# Patient Record
Sex: Male | Born: 1950 | Race: White | Hispanic: No | Marital: Single
Health system: Southern US, Community
[De-identification: ages and names within clinical notes are randomized; demographics above are authoritative.]

---

## 2001-01-22 ENCOUNTER — Emergency Department (HOSPITAL_COMMUNITY): Admission: EM | Admit: 2001-01-22 | Discharge: 2001-01-23 | Payer: Self-pay | Admitting: Emergency Medicine

## 2002-03-28 ENCOUNTER — Emergency Department (HOSPITAL_COMMUNITY): Admission: EM | Admit: 2002-03-28 | Discharge: 2002-03-28 | Payer: Self-pay | Admitting: Emergency Medicine

## 2004-05-17 ENCOUNTER — Ambulatory Visit (HOSPITAL_COMMUNITY): Admission: RE | Admit: 2004-05-17 | Discharge: 2004-05-17 | Payer: Self-pay | Admitting: Specialist

## 2004-07-06 ENCOUNTER — Ambulatory Visit: Payer: Self-pay | Admitting: Family Medicine

## 2004-07-20 ENCOUNTER — Ambulatory Visit: Payer: Self-pay | Admitting: Family Medicine

## 2004-08-21 ENCOUNTER — Ambulatory Visit: Payer: Self-pay | Admitting: Family Medicine

## 2004-09-18 ENCOUNTER — Ambulatory Visit (HOSPITAL_BASED_OUTPATIENT_CLINIC_OR_DEPARTMENT_OTHER): Admission: RE | Admit: 2004-09-18 | Discharge: 2004-09-18 | Payer: Self-pay | Admitting: Specialist

## 2004-11-01 ENCOUNTER — Ambulatory Visit: Payer: Self-pay | Admitting: Family Medicine

## 2004-11-29 ENCOUNTER — Ambulatory Visit: Payer: Self-pay | Admitting: Family Medicine

## 2004-12-18 ENCOUNTER — Ambulatory Visit: Payer: Self-pay | Admitting: Family Medicine

## 2004-12-19 ENCOUNTER — Ambulatory Visit: Payer: Self-pay | Admitting: Family Medicine

## 2004-12-27 ENCOUNTER — Ambulatory Visit: Payer: Self-pay | Admitting: Family Medicine

## 2005-01-25 ENCOUNTER — Ambulatory Visit: Payer: Self-pay | Admitting: Family Medicine

## 2005-02-15 ENCOUNTER — Ambulatory Visit: Payer: Self-pay | Admitting: Family Medicine

## 2005-03-13 ENCOUNTER — Ambulatory Visit: Payer: Self-pay | Admitting: Family Medicine

## 2005-04-09 ENCOUNTER — Ambulatory Visit: Payer: Self-pay | Admitting: Family Medicine

## 2005-04-24 ENCOUNTER — Ambulatory Visit: Payer: Self-pay | Admitting: Family Medicine

## 2005-05-23 ENCOUNTER — Ambulatory Visit: Payer: Self-pay | Admitting: Family Medicine

## 2005-08-02 ENCOUNTER — Ambulatory Visit: Payer: Self-pay | Admitting: Family Medicine

## 2005-09-06 ENCOUNTER — Ambulatory Visit: Payer: Self-pay | Admitting: Family Medicine

## 2005-10-16 ENCOUNTER — Ambulatory Visit: Payer: Self-pay | Admitting: Family Medicine

## 2006-06-10 ENCOUNTER — Encounter: Payer: Self-pay | Admitting: Pulmonary Disease

## 2010-04-17 ENCOUNTER — Ambulatory Visit: Payer: Self-pay | Admitting: Pulmonary Disease

## 2010-04-17 DIAGNOSIS — G4700 Insomnia, unspecified: Secondary | ICD-10-CM

## 2010-04-17 DIAGNOSIS — E785 Hyperlipidemia, unspecified: Secondary | ICD-10-CM | POA: Insufficient documentation

## 2010-08-29 NOTE — Assessment & Plan Note (Signed)
Summary: consult for insomnia   Visit Type:  Initial Consult Copy to:  Dina Rich MD Primary Provider/Referring Provider:  Dina Rich MD  CC:  Sleep consult.  History of Present Illness: The pt is a 59y/o male who I have been asked to see for insomnia.  He has had issues with his sleep for over 20 years, and had sleep study in the past (2007)which showed no sleep disordered breathing or other significant cause for sleep disruption.  He has a history of working rotating shifts for many years, and describes his current sleep as "variable".  He typically goes to bed around 11pm, and feels he is not sleepy at that time.  It may take btw one and four hours to fall asleep, and he typically stays in bed and tosses and turns.  He also describes anxiety and panic at times.  Even if he falls asleep in one hour, he has issues with very frequent awakenings during the night and takes 30-38min to get back to sleep.  He c/o overactive bladder keeping him awake, and also has issues with chronic pain in his joints and muscles.  He does sleep in a different bed from his wife due to her snoring.  He will usually arise btw 6-8am to start his day.  He will occasionally nap during the day, but does not doze watching tv.  He denies any sleepiness with driving.  He does not drink coffee or tea, and rarely has caffeinated soda.  The pt's weight is down 30 pounds over the past 2 years.  Preventive Screening-Counseling & Management  Alcohol-Tobacco     Smoking Status: quit  Current Medications (verified): 1)  Methadone Hcl 5 Mg Tabs (Methadone Hcl) 2)  Baclofen 10 Mg Tabs (Baclofen) .... Take 1 Tablet By Mouth Four Times A Day As Needed 3)  Flurazepam Hcl 30 Mg Caps (Flurazepam Hcl) .... Take 1 Tab By Mouth At Bedtime As Needed For Sleep 4)  Pravastatin Sodium 80 Mg Tabs (Pravastatin Sodium) .... Take 1 Tablet By Mouth Once A Day  Allergies (verified): 1)  ! * Venlafaxine 2)  ! Morphine 3)  ! *  Cyclobenzaprine  Past History:  Past Medical History: : HYPERLIPIDEMIA (ICD-272.4)  Past Surgical History: 3 shoulder surgeries finger surgery  Family History: Reviewed history and no changes required. Lung cancer--brother, aunt, uncle Emphysema--father  Social History: Reviewed history and no changes required. Married lives with wife and son Children--2 Occupation--Disabled-- wroked at [employer] Patient states former smoker. Quit in 1982. smoked 2 ppd.  Smoking Status:  quit  Review of Systems       The patient complains of headaches and joint stiffness or pain.  The patient denies shortness of breath with activity, shortness of breath at rest, productive cough, non-productive cough, coughing up blood, chest pain, irregular heartbeats, acid heartburn, indigestion, loss of appetite, weight change, abdominal pain, difficulty swallowing, sore throat, tooth/dental problems, nasal congestion/difficulty breathing through nose, sneezing, itching, ear ache, anxiety, depression, hand/feet swelling, rash, change in color of mucus, and fever.    Vital Signs:  Patient profile:   60 year old male Height:      70 inches Weight:      204.13 pounds BMI:     29.40 O2 Sat:      98 % on Room air Temp:     97.5 degrees F oral Pulse rate:   61 / minute BP sitting:   136 / 80  (right arm) Cuff size:   regular  Vitals Entered By: Carver Fila (April 17, 2010 2:04 PM)  O2 Flow:  Room air CC: Sleep consult Comments meds and allergies updated Phone number updated Carver Fila  April 17, 2010 2:09 PM    Physical Exam  General:  wd male in nad Eyes:  PERRLA and EOMI.   Nose:  moderate turbinate hypertrophy, no purulence noted. Mouth:  normal palate and uvula, no exudates seen Neck:  no jvd, tmg, LN Lungs:  clear to auscultation Heart:  rrr, no mrg Abdomen:  soft and nontender, bs+ Extremities:  no edema or cyanosis pulses intact distally Neurologic:  alert and oriented, moves  all 4.   Impression & Recommendations:  Problem # 1:  PERSISTENT DISORDER INITIATING/MAINTAINING SLEEP (ICD-307.42) the pt is describing psychophysiologic insomnia that is being complicated by anxiety and chronic pain.  I have explained to the pt this is best treated with behavioral therapy rather than sedative type medications.  He will also need control of his pain at night, and treatment for his high anxiety level when going to bed.  I have outlined the steps of stimulus control therapy, but I suspect he will need input from behavioral specialist with CBT and treatment of his anxiety.  This is a very difficult problem, and is deep seeded from at least 20 years.  I expect very slow progress over time.  Patient Instructions: 1)  go to bed each night when you start getting sleepy...pick a time such as 1am..Marland KitchenIf you are not able to initiate sleep within , leave bedroom and read or watch tv until you can re-initiate sleep.  Do this as many times as it takes until you fall asleep or 7am comes. 2)  no napping during day, no caffeine after 10am. 3)  will send a note to Dr. Sol Passer recommending referral to psychology for CBT (cognitive behavioral therapy) and for treatment of anxiety.  Would also recommend working on pain control at night   Immunization History:  Influenza Immunization History:    Influenza:  historical (04/29/2009)   Appended Document: Orders Update    Clinical Lists Changes  Orders: Added new Service order of Consultation Level V [phone]) - Signed

## 2010-12-15 NOTE — Op Note (Signed)
Jack Bowers, Jack Bowers                  ACCOUNT NO.:  [id number]   MEDICAL RECORD NO.:  [id number]          PATIENT TYPE:  AMB   LOCATION:  DAY                          FACILITY[facility]  [facility]   PHYSICIAN:  Jene Every, M.D.    DATE OF BIRTH:  Jan 20, 1951   DATE OF PROCEDURE:  05/17/2004  DATE OF DISCHARGE:                                 OPERATIVE REPORT   PREOPERATIVE DIAGNOSIS:  Recurrent rotator cuff tear and impingement  syndrome of the right shoulder.   POSTOPERATIVE DIAGNOSES:  1.  Anterior labral tear, right shoulder.  2.  Impingement syndrome, right shoulder.  3.  Bursal-side tear of the rotator cuff.   PROCEDURES PERFORMED:  Right shoulder arthroscopy, debridement of labral  tear, subacromial decompression with bursectomy, and debridement of partial  tear of rotator cuff.   ANESTHESIA:  General.   ASSISTANT:  Roma Schanz, P.A.   BRIEF HISTORY AND INDICATION:  A 60 year old with refractory shoulder pain,  status post rotator cuff repair in the past, with increasing pain despite  conservative treatment, including subacromial corticosteroid injection, MRI  indicating possible rotator cuff arthropathy, impingement.  Operative  intervention is indicated for diagnosis and treatment.  Risks and benefits  discussed, including bleeding, infection, damage to vascular structures, no  change in symptoms, worsening symptoms, etc.   TECHNIQUE:  With the patient in supine position, __________ positioner,  after induction of adequate general anesthesia, 1 g Kefzol, the right  shoulder and upper extremity were prepped and draped in the usual sterile  fashion.  A surgical marker was utilized to delineate the acromion, AC joint  and the coracoid.  An incision was made over the skin for the posterolateral  portal.  With the arm in the 70/30 position, the cannula was advanced to the  glenohumeral joint, penetrating it atraumatically.  Irrigant was utilized to  insufflate the joint.   Immediately noted was degenerative tearing of the  anterior portion of the labrum.  I made an incision for an anterior portal  off the anterolateral aspect of the acromion and about a third of the way  toward the coracoid, advanced it into the joint beneath the biceps tendon.  The tear there appeared to be catching in between the glenoid and the  humerus.  I introduced the shaver and utilized to debride it to a stable  base.  I probed it.  There was no detachment of the labrum.  There was no  SLAP-type lesion.  The biceps was unremarkable.  Some degenerative changes  of the glenohumeral joint noted.  The rotator cuff, full inspection of that  revealed no evidence of a full-thickness tear.  Degenerative changes were  noted, however.  We redirected the camera into the subacromial space and  made incision for a lateral portal and advanced the cannula.  Noticed  immediately was exuberant hypertrophic bursa.  The shaver was introduced and  utilized to perform a bursectomy to delineate the acromion.  Exam of the  rotator cuff, the previous sutures, the Ethibond sutures were noted.  There  was a small bursal-side tear  of the rotator cuff.  This was probed.  There  was no evidence of a full-thickness tear noted.  No bony impingement was  noted.  There was again hypertrophic bursa, fibrosis, scar tissue.  This was  lysed and debrided.  Full inspection again of the rotator cuff revealed no  evidence of a full-thickness tear.  After the bursectomy, we then copiously  lavaged the joint.  All instrumentation was removed.  Portals were closed  with 4-0 nylon simple sutures.  Marcaine 0.25% with epinephrine was  infiltrated in the joint.  The wound was  dressed sterilely.  Placed in a sling, extubated without difficulty, and  transported to the recovery room in satisfactory condition.   The patient tolerated the procedure well with no complications.     [name]   JB/MEDQ  D:  05/17/2004  T:  05/17/2004   Job:  [id number]

## 2010-12-15 NOTE — Op Note (Signed)
NAMEDRAYLEN, Bowers                  ACCOUNT NO.:  [id number]   MEDICAL RECORD NO.:  [id number]          PATIENT TYPE:  AMB   LOCATION:  [facility]                         FACILITY:  [facility]   PHYSICIAN:  Jene Every, M.D.    DATE OF BIRTH:  February 07, 1951   DATE OF PROCEDURE:  DATE OF DISCHARGE:                                 OPERATIVE REPORT   PREOPERATIVE DIAGNOSES:  1.  Rotator cuff repair.  2.  Impingement syndrome, left shoulder.   POSTOPERATIVE DIAGNOSES:  1.  Impingement syndrome, left shoulder.  2.  Minor degenerative fraying of rotator cuff.   PROCEDURE PERFORMED:  1.  Examination under anesthesia.  2.  Left shoulder arthroscopy.  3.  Subacromial decompression.  4.  Bursectomy.   ANESTHESIA:  General.   ASSISTANT:  Roma Schanz, P.A.   HISTORY/INDICATION:  This is a 60 year old male with refractory impingement  syndrome of the left shoulder.  Despite conservative treatment and risks,  subacromial corticosteroid injections which gave him temporary relief, he  had impingement pain.  MRI, though, indicating no evidence of a true rotator  cuff tear with significant bony pathology.  It was not tender over the [facility].  Due to the fact that he was unable to continue to work at [employer], he was  indicated for a diagnostic arthroscopy and bursectomy, possible open rotator  cuff repair of the rotator cuff was noted.  Risks and benefits were  discussed including bleeding, infection, damage to vascular structures,  suboptimal range of motion, recurrent tear, adhesive capsulitis, no changes  in symptoms, worsening symptoms, etc.   TECHNIQUE:  The patient was placed in the supine beach chair position after  the induction of adequate general anesthesia and 1 g Kefzol.  Left shoulder  and upper extremity was prepped and draped in the usual sterile fashion.  I  performed range of motion.  He had no significant adhesive capsulitis.  A  marker was utilized to delineate the acromion and the  coracoid.  A 0.25%  Marcaine with epinephrine was infiltrated in the subacromial space.  Incision was made posterolaterally to the skin only and over the  anterolateral aspect of the acromion approximately 3 cm from the corner.  With 70/30 traction applied to the arm, a blunt cannula was introduced into  the glenohumeral joint through the capsule.  It penetrated atraumatically  towards the coracoid without difficulty.  Irrigant was utilized to  insufflate the joint and inspection revealed normal labrum, normal biceps,  normal biceps attachment, normal subscap, minor fraying of the undersurface  of the rotator cuff.  No significant glenohumeral degenerative changes were  noted.  We then redirected the camera in the subacromial space and placed a  portal anterolaterally in the subacromial space.  Hypertrophic bursa was  noted with adhesions.  These adhesions were debrided with a 4.2 shaver.  We  then detached the ligament from the anterolateral aspect of the acromion  utilizing an ArthroWand.  Full inspection of the rotator cuff and probing of  it anteriorly and posteriorly revealed no significant defects.  Full  bursectomy had been performed.  We had marked the anterolateral aspect of  the acromion with an 18-gauge needle for delineation.  Next, the joint was  copiously lavaged.  All instrumentation was removed.  Portals were closed  with 4-0 nylon simple sutures.  A 0.25% Marcaine with epinephrine was  infiltrated in the subcutaneous space in the subacromial joint.  The wound  was then dressed sterilely.  The patient was extubated without difficulty  and transported to the recovery room in satisfactory condition.   The patient tolerated the procedure without complication.      JB/MEDQ  D:  09/18/2004  T:  09/18/2004  Job:  [id number]

## 2015-10-27 ENCOUNTER — Ambulatory Visit
Admission: RE | Admit: 2015-10-27 | Discharge: 2015-10-27 | Disposition: A | Discharge status: Home / Self Care | Payer: Medicare Other | Source: Ambulatory Visit | Attending: Physical Medicine and Rehabilitation | Admitting: Physical Medicine and Rehabilitation

## 2015-10-27 ENCOUNTER — Other Ambulatory Visit: Payer: Self-pay | Admitting: Physical Medicine and Rehabilitation

## 2015-10-27 DIAGNOSIS — M79672 Pain in left foot: Secondary | ICD-10-CM

## 2017-07-09 ENCOUNTER — Other Ambulatory Visit: Payer: Self-pay | Admitting: Physical Medicine and Rehabilitation

## 2017-07-09 DIAGNOSIS — M545 Low back pain: Secondary | ICD-10-CM

## 2017-07-13 ENCOUNTER — Ambulatory Visit
Admission: RE | Admit: 2017-07-13 | Discharge: 2017-07-13 | Disposition: A | Discharge status: Home / Self Care | Payer: Medicare Other | Source: Ambulatory Visit | Attending: Physical Medicine and Rehabilitation | Admitting: Physical Medicine and Rehabilitation

## 2017-07-13 DIAGNOSIS — M545 Low back pain: Secondary | ICD-10-CM

## 2018-10-20 DIAGNOSIS — M797 Fibromyalgia: Secondary | ICD-10-CM

## 2018-10-20 DIAGNOSIS — E119 Type 2 diabetes mellitus without complications: Secondary | ICD-10-CM | POA: Diagnosis not present

## 2018-10-20 DIAGNOSIS — N179 Acute kidney failure, unspecified: Secondary | ICD-10-CM

## 2018-10-20 DIAGNOSIS — N183 Chronic kidney disease, stage 3 (moderate): Secondary | ICD-10-CM | POA: Diagnosis not present

## 2018-10-20 DIAGNOSIS — E860 Dehydration: Secondary | ICD-10-CM

## 2018-10-20 DIAGNOSIS — I10 Essential (primary) hypertension: Secondary | ICD-10-CM

## 2018-10-20 DIAGNOSIS — R6889 Other general symptoms and signs: Secondary | ICD-10-CM

## 2018-10-20 DIAGNOSIS — Q809 Congenital ichthyosis, unspecified: Secondary | ICD-10-CM

## 2018-10-21 DIAGNOSIS — E119 Type 2 diabetes mellitus without complications: Secondary | ICD-10-CM | POA: Diagnosis not present

## 2018-10-21 DIAGNOSIS — N183 Chronic kidney disease, stage 3 (moderate): Secondary | ICD-10-CM | POA: Diagnosis not present

## 2018-10-21 DIAGNOSIS — R6889 Other general symptoms and signs: Secondary | ICD-10-CM | POA: Diagnosis not present

## 2018-10-21 DIAGNOSIS — N179 Acute kidney failure, unspecified: Secondary | ICD-10-CM | POA: Diagnosis not present

## 2018-10-22 DIAGNOSIS — N179 Acute kidney failure, unspecified: Secondary | ICD-10-CM | POA: Diagnosis not present

## 2018-10-22 DIAGNOSIS — E119 Type 2 diabetes mellitus without complications: Secondary | ICD-10-CM | POA: Diagnosis not present

## 2018-10-23 ENCOUNTER — Inpatient Hospital Stay (HOSPITAL_COMMUNITY)
Admission: AD | Admit: 2018-10-23 | Discharge: 2018-11-28 | DRG: 870 | Disposition: E | Payer: Medicare Other | Source: Other Acute Inpatient Hospital | Attending: Pulmonary Disease | Admitting: Pulmonary Disease

## 2018-10-23 ENCOUNTER — Inpatient Hospital Stay: Payer: Self-pay

## 2018-10-23 ENCOUNTER — Inpatient Hospital Stay (HOSPITAL_COMMUNITY): Payer: Medicare Other

## 2018-10-23 DIAGNOSIS — G4730 Sleep apnea, unspecified: Secondary | ICD-10-CM | POA: Diagnosis not present

## 2018-10-23 DIAGNOSIS — Z885 Allergy status to narcotic agent status: Secondary | ICD-10-CM | POA: Diagnosis not present

## 2018-10-23 DIAGNOSIS — E872 Acidosis: Secondary | ICD-10-CM | POA: Diagnosis not present

## 2018-10-23 DIAGNOSIS — J920 Pleural plaque with presence of asbestos: Secondary | ICD-10-CM | POA: Diagnosis present

## 2018-10-23 DIAGNOSIS — Z515 Encounter for palliative care: Secondary | ICD-10-CM | POA: Diagnosis not present

## 2018-10-23 DIAGNOSIS — G9341 Metabolic encephalopathy: Secondary | ICD-10-CM | POA: Diagnosis present

## 2018-10-23 DIAGNOSIS — N179 Acute kidney failure, unspecified: Secondary | ICD-10-CM | POA: Diagnosis not present

## 2018-10-23 DIAGNOSIS — I129 Hypertensive chronic kidney disease with stage 1 through stage 4 chronic kidney disease, or unspecified chronic kidney disease: Secondary | ICD-10-CM | POA: Diagnosis not present

## 2018-10-23 DIAGNOSIS — A4189 Other specified sepsis: Secondary | ICD-10-CM | POA: Diagnosis present

## 2018-10-23 DIAGNOSIS — E874 Mixed disorder of acid-base balance: Secondary | ICD-10-CM | POA: Diagnosis not present

## 2018-10-23 DIAGNOSIS — I952 Hypotension due to drugs: Secondary | ICD-10-CM | POA: Diagnosis not present

## 2018-10-23 DIAGNOSIS — N183 Chronic kidney disease, stage 3 unspecified: Secondary | ICD-10-CM

## 2018-10-23 DIAGNOSIS — E1165 Type 2 diabetes mellitus with hyperglycemia: Secondary | ICD-10-CM | POA: Diagnosis not present

## 2018-10-23 DIAGNOSIS — T4275XA Adverse effect of unspecified antiepileptic and sedative-hypnotic drugs, initial encounter: Secondary | ICD-10-CM | POA: Diagnosis not present

## 2018-10-23 DIAGNOSIS — A419 Sepsis, unspecified organism: Secondary | ICD-10-CM

## 2018-10-23 DIAGNOSIS — G43909 Migraine, unspecified, not intractable, without status migrainosus: Secondary | ICD-10-CM | POA: Diagnosis not present

## 2018-10-23 DIAGNOSIS — J9601 Acute respiratory failure with hypoxia: Secondary | ICD-10-CM | POA: Diagnosis present

## 2018-10-23 DIAGNOSIS — L8990 Pressure ulcer of unspecified site, unspecified stage: Secondary | ICD-10-CM

## 2018-10-23 DIAGNOSIS — Z66 Do not resuscitate: Secondary | ICD-10-CM | POA: Diagnosis not present

## 2018-10-23 DIAGNOSIS — J189 Pneumonia, unspecified organism: Secondary | ICD-10-CM | POA: Diagnosis present

## 2018-10-23 DIAGNOSIS — F329 Major depressive disorder, single episode, unspecified: Secondary | ICD-10-CM | POA: Diagnosis not present

## 2018-10-23 DIAGNOSIS — R680 Hypothermia, not associated with low environmental temperature: Secondary | ICD-10-CM | POA: Diagnosis not present

## 2018-10-23 DIAGNOSIS — Z87891 Personal history of nicotine dependence: Secondary | ICD-10-CM

## 2018-10-23 DIAGNOSIS — E119 Type 2 diabetes mellitus without complications: Secondary | ICD-10-CM | POA: Diagnosis not present

## 2018-10-23 DIAGNOSIS — I4891 Unspecified atrial fibrillation: Secondary | ICD-10-CM | POA: Diagnosis not present

## 2018-10-23 DIAGNOSIS — R6521 Severe sepsis with septic shock: Secondary | ICD-10-CM | POA: Diagnosis present

## 2018-10-23 DIAGNOSIS — J9690 Respiratory failure, unspecified, unspecified whether with hypoxia or hypercapnia: Secondary | ICD-10-CM

## 2018-10-23 DIAGNOSIS — E1122 Type 2 diabetes mellitus with diabetic chronic kidney disease: Secondary | ICD-10-CM | POA: Diagnosis present

## 2018-10-23 DIAGNOSIS — J80 Acute respiratory distress syndrome: Secondary | ICD-10-CM

## 2018-10-23 DIAGNOSIS — J9600 Acute respiratory failure, unspecified whether with hypoxia or hypercapnia: Secondary | ICD-10-CM

## 2018-10-23 DIAGNOSIS — E875 Hyperkalemia: Secondary | ICD-10-CM | POA: Diagnosis not present

## 2018-10-23 DIAGNOSIS — Z978 Presence of other specified devices: Secondary | ICD-10-CM

## 2018-10-23 DIAGNOSIS — Z4659 Encounter for fitting and adjustment of other gastrointestinal appliance and device: Secondary | ICD-10-CM

## 2018-10-23 LAB — BLOOD GAS, ARTERIAL
Acid-base deficit: 9.7 mmol/L — ABNORMAL HIGH (ref 0.0–2.0)
Acid-base deficit: 7.8 mmol/L — ABNORMAL HIGH (ref 0.0–2.0)
Bicarbonate: 15.2 mmol/L — ABNORMAL LOW (ref 20.0–28.0)
Bicarbonate: 16.3 mmol/L — ABNORMAL LOW (ref 20.0–28.0)
Drawn by: 270211
Drawn by: 308601
FIO2: 100
FIO2: 100
MECHVT: 510 mL
O2 SAT: 97.4 %
O2 Saturation: 98.2 %
PEEP/CPAP: 12 cmH2O
PEEP: 8 cmH2O
PH ART: 7.326 — AB (ref 7.350–7.450)
Patient temperature: 98.6
Patient temperature: 100.9
RATE: 22 resp/min
RATE: 22 resp/min
VT: 510 mL
pCO2 arterial: 31.6 mmHg — ABNORMAL LOW (ref 32.0–48.0)
pCO2 arterial: 32.8 mmHg (ref 32.0–48.0)
pH, Arterial: 7.304 — ABNORMAL LOW (ref 7.350–7.450)
pO2, Arterial: 107 mmHg (ref 83.0–108.0)
pO2, Arterial: 132 mmHg — ABNORMAL HIGH (ref 83.0–108.0)

## 2018-10-23 LAB — CBC
HCT: 36.5 % — ABNORMAL LOW (ref 39.0–52.0)
Hemoglobin: 12 g/dL — ABNORMAL LOW (ref 13.0–17.0)
MCH: 31.5 pg (ref 26.0–34.0)
MCHC: 32.9 g/dL (ref 30.0–36.0)
MCV: 95.8 fL (ref 80.0–100.0)
Platelets: 156 10*3/uL (ref 150–400)
RBC: 3.81 MIL/uL — ABNORMAL LOW (ref 4.22–5.81)
RDW: 14.2 % (ref 11.5–15.5)
WBC: 5.9 10*3/uL (ref 4.0–10.5)
nRBC: 0 % (ref 0.0–0.2)

## 2018-10-23 LAB — CREATININE, SERUM
Creatinine, Ser: 1.57 mg/dL — ABNORMAL HIGH (ref 0.61–1.24)
GFR calc Af Amer: 51 mL/min — ABNORMAL LOW (ref >60)
GFR calc non Af Amer: 44 mL/min — ABNORMAL LOW (ref >60)

## 2018-10-23 LAB — TRIGLYCERIDES: Triglycerides: 247 mg/dL — ABNORMAL HIGH (ref <150)

## 2018-10-23 MED ORDER — FENTANYL BOLUS VIA INFUSION
50.0000 ug | INTRAVENOUS | Status: DC | PRN
  Filled 2018-10-23: qty 50

## 2018-10-23 MED ORDER — VECURONIUM BROMIDE 10 MG IV SOLR
10.0000 mg | Freq: Once | INTRAVENOUS | Status: AC
  Administered 2018-10-23: 10 mg via INTRAVENOUS
  Filled 2018-10-23: qty 10

## 2018-10-23 MED ORDER — SODIUM CHLORIDE 0.9 % IV SOLN
2.0000 g | INTRAVENOUS | Status: DC
  Administered 2018-10-24 – 2018-10-26 (×3): 2 g via INTRAVENOUS
  Filled 2018-10-23 (×3): qty 2

## 2018-10-23 MED ORDER — SODIUM CHLORIDE 0.9% FLUSH
10.0000 mL | Freq: Two times a day (BID) | INTRAVENOUS | Status: DC
  Administered 2018-10-23: 30 mL
  Administered 2018-10-24 – 2018-10-26 (×6): 10 mL
  Administered 2018-10-27: 30 mL
  Administered 2018-10-27 – 2018-10-28 (×2): 10 mL
  Administered 2018-10-29: 40 mL
  Administered 2018-10-29 – 2018-10-31 (×4): 10 mL

## 2018-10-23 MED ORDER — NOREPINEPHRINE 4 MG/250ML-% IV SOLN
0.0000 ug/min | INTRAVENOUS | Status: DC
  Administered 2018-10-23: 2 ug/min via INTRAVENOUS
  Filled 2018-10-23: qty 250

## 2018-10-23 MED ORDER — FENTANYL 2500MCG IN NS 250ML (10MCG/ML) PREMIX INFUSION
100.0000 ug/h | INTRAVENOUS | Status: DC
  Administered 2018-10-24 – 2018-10-25 (×2): 100 ug/h via INTRAVENOUS
  Administered 2018-10-26: 125 ug/h via INTRAVENOUS
  Filled 2018-10-23 (×3): qty 250

## 2018-10-23 MED ORDER — ARTIFICIAL TEARS OPHTHALMIC OINT
1.0000 "application " | TOPICAL_OINTMENT | Freq: Three times a day (TID) | OPHTHALMIC | Status: DC
  Administered 2018-10-23 – 2018-10-28 (×15): 1 via OPHTHALMIC
  Filled 2018-10-23: qty 3.5

## 2018-10-23 MED ORDER — SODIUM CHLORIDE 0.9% FLUSH: 10.0000 mL | INTRAVENOUS | Status: DC | PRN

## 2018-10-23 MED ORDER — SODIUM CHLORIDE 0.9 % IV SOLN
500.0000 mg | INTRAVENOUS | Status: DC
  Administered 2018-10-24 – 2018-10-30 (×7): 500 mg via INTRAVENOUS
  Filled 2018-10-23 (×7): qty 500

## 2018-10-23 MED ORDER — FENTANYL CITRATE (PF) 100 MCG/2ML IJ SOLN: 100.0000 ug | Freq: Once | INTRAMUSCULAR | Status: DC

## 2018-10-23 MED ORDER — SODIUM CHLORIDE 0.9 % IV SOLN
3.0000 ug/kg/min | INTRAVENOUS | Status: DC
  Administered 2018-10-23: 3 ug/kg/min via INTRAVENOUS
  Administered 2018-10-24 – 2018-10-28 (×6): 3.5 ug/kg/min via INTRAVENOUS
  Filled 2018-10-23 (×11): qty 20

## 2018-10-23 MED ORDER — PANTOPRAZOLE SODIUM 40 MG IV SOLR
40.0000 mg | Freq: Every day | INTRAVENOUS | Status: DC
  Administered 2018-10-23: 40 mg via INTRAVENOUS
  Filled 2018-10-23: qty 40

## 2018-10-23 MED ORDER — FENTANYL BOLUS VIA INFUSION
50.0000 ug | INTRAVENOUS | Status: DC | PRN
  Administered 2018-10-23 – 2018-10-26 (×2): 50 ug via INTRAVENOUS
  Filled 2018-10-23: qty 50

## 2018-10-23 MED ORDER — NOREPINEPHRINE 16 MG/250ML-% IV SOLN
0.0000 ug/min | INTRAVENOUS | Status: DC
  Administered 2018-10-24: 4 ug/min via INTRAVENOUS
  Administered 2018-10-26: 10 ug/min via INTRAVENOUS
  Filled 2018-10-23 (×3): qty 250

## 2018-10-23 MED ORDER — ORAL CARE MOUTH RINSE
15.0000 mL | OROMUCOSAL | Status: DC
  Administered 2018-10-23 – 2018-10-31 (×59): 15 mL via OROMUCOSAL

## 2018-10-23 MED ORDER — FENTANYL CITRATE (PF) 100 MCG/2ML IJ SOLN: 100.0000 ug | Freq: Once | INTRAMUSCULAR | Status: DC | PRN

## 2018-10-23 MED ORDER — FENTANYL CITRATE (PF) 100 MCG/2ML IJ SOLN
50.0000 ug | Freq: Once | INTRAMUSCULAR | Status: AC
  Administered 2018-10-23: 50 ug via INTRAVENOUS
  Filled 2018-10-23: qty 2

## 2018-10-23 MED ORDER — PROPOFOL 1000 MG/100ML IV EMUL
25.0000 ug/kg/min | INTRAVENOUS | Status: DC
  Administered 2018-10-24: 25 ug/kg/min via INTRAVENOUS
  Administered 2018-10-24: 35 ug/kg/min via INTRAVENOUS
  Administered 2018-10-24: 25 ug/kg/min via INTRAVENOUS
  Administered 2018-10-24: 45 ug/kg/min via INTRAVENOUS
  Administered 2018-10-24 – 2018-10-26 (×5): 25 ug/kg/min via INTRAVENOUS
  Administered 2018-10-26 (×3): 40 ug/kg/min via INTRAVENOUS
  Administered 2018-10-26: 25 ug/kg/min via INTRAVENOUS
  Administered 2018-10-27 (×2): 40 ug/kg/min via INTRAVENOUS
  Filled 2018-10-23 (×17): qty 100

## 2018-10-23 MED ORDER — HEPARIN SODIUM (PORCINE) 5000 UNIT/ML IJ SOLN
5000.0000 [IU] | Freq: Three times a day (TID) | INTRAMUSCULAR | Status: DC
  Administered 2018-10-23 – 2018-10-31 (×23): 5000 [IU] via SUBCUTANEOUS
  Filled 2018-10-23 (×22): qty 1

## 2018-10-23 MED ORDER — INSULIN ASPART 100 UNIT/ML ~~LOC~~ SOLN
0.0000 [IU] | SUBCUTANEOUS | Status: DC
  Administered 2018-10-24 – 2018-10-25 (×2): 2 [IU] via SUBCUTANEOUS
  Administered 2018-10-25 (×2): 3 [IU] via SUBCUTANEOUS
  Administered 2018-10-26: 2 [IU] via SUBCUTANEOUS
  Administered 2018-10-26: 3 [IU] via SUBCUTANEOUS
  Administered 2018-10-26: 5 [IU] via SUBCUTANEOUS
  Administered 2018-10-26: 2 [IU] via SUBCUTANEOUS
  Administered 2018-10-26: 3 [IU] via SUBCUTANEOUS
  Administered 2018-10-26: 2 [IU] via SUBCUTANEOUS
  Administered 2018-10-27 (×4): 3 [IU] via SUBCUTANEOUS
  Administered 2018-10-27 (×2): 2 [IU] via SUBCUTANEOUS
  Administered 2018-10-27: 3 [IU] via SUBCUTANEOUS
  Administered 2018-10-28: 8 [IU] via SUBCUTANEOUS
  Administered 2018-10-28: 5 [IU] via SUBCUTANEOUS
  Administered 2018-10-28: 11 [IU] via SUBCUTANEOUS
  Administered 2018-10-28: 3 [IU] via SUBCUTANEOUS

## 2018-10-23 MED ORDER — FENTANYL 2500MCG IN NS 250ML (10MCG/ML) PREMIX INFUSION
25.0000 ug/h | INTRAVENOUS | Status: DC
  Administered 2018-10-23: 25 ug/h via INTRAVENOUS
  Filled 2018-10-23: qty 250

## 2018-10-23 MED ORDER — CHLORHEXIDINE GLUCONATE CLOTH 2 % EX PADS
6.0000 | MEDICATED_PAD | Freq: Every day | CUTANEOUS | Status: DC
  Administered 2018-10-24 – 2018-10-25 (×3): 6 via TOPICAL

## 2018-10-23 MED ORDER — LACTATED RINGERS IV SOLN
INTRAVENOUS | Status: DC
  Administered 2018-10-24 – 2018-10-25 (×4): via INTRAVENOUS

## 2018-10-23 MED ORDER — CHLORHEXIDINE GLUCONATE 0.12% ORAL RINSE (MEDLINE KIT)
15.0000 mL | Freq: Two times a day (BID) | OROMUCOSAL | Status: DC
  Administered 2018-10-23 – 2018-10-31 (×16): 15 mL via OROMUCOSAL

## 2018-10-23 MED ORDER — PROPOFOL 1000 MG/100ML IV EMUL
0.0000 ug/kg/min | INTRAVENOUS | Status: DC
  Administered 2018-10-23: 30 ug/kg/min via INTRAVENOUS
  Filled 2018-10-23: qty 100

## 2018-10-23 MED ORDER — NOREPINEPHRINE BITARTRATE 1 MG/ML IV SOLN
0.0000 ug/min | INTRAVENOUS | Status: DC
  Filled 2018-10-23: qty 4

## 2018-10-23 NOTE — Progress Notes (Signed)
Peripherally Inserted Central Catheter/Midline Placement  The IV Nurse has discussed with the patient and/or persons authorized to consent for the patient, the purpose of this procedure and the potential benefits and risks involved with this procedure.  The benefits include less needle sticks, lab draws from the catheter, and the patient may be discharged home with the catheter. Risks include, but not limited to, infection, bleeding, blood clot (thrombus formation), and puncture of an artery; nerve damage and irregular heartbeat and possibility to perform a PICC exchange if needed/ordered by physician.  Alternatives to this procedure were also discussed.  Bard Power PICC patient education guide, fact sheet on infection prevention and patient information card has been provided to patient /or left at bedside.    PICC/Midline Placement Documentation  PICC Triple Lumen 10-30-2018 PICC Right Brachial 45 cm 0 cm (Active)  Indication for Insertion or Continuance of Line Limited venous access - need for IV therapy >5 days (PICC only);Vasoactive infusions 10/30/2018  7:05 PM  Exposed Catheter (cm) 0 cm October 30, 2018  7:05 PM  Site Assessment Clean;Dry;Intact Oct 30, 2018  7:05 PM  Lumen #1 Status Flushed;Saline locked;Blood return noted 30-Oct-2018  7:05 PM  Lumen #2 Status Flushed;Saline locked;Blood return noted October 30, 2018  7:05 PM  Lumen #3 Status Flushed;Saline locked;Blood return noted 2018/10/30  7:05 PM  Dressing Type Transparent 2018-10-30  7:05 PM  Dressing Status Clean;Dry;Intact;Antimicrobial disc in place 10/30/2018  7:05 PM  Dressing Change Due 10/30/18 10-30-2018  7:05 PM       Ethelda Chick October 30, 2018, 7:07 PM

## 2018-10-23 NOTE — H&P (Signed)
NAME:  MEREL KARAMAN, MRN:  [id number], DOB:  13-Aug-1950, LOS: 0 ADMISSION DATE:  (Not on file), CONSULTATION DATE:  3/26 REFERRING MD:  N/A, CHIEF COMPLAINT:  Acute hypoxic respiratory failure   Brief History   68 year old male patient admitted 3/26 with acute hypoxic respiratory failure in the setting of community-acquired pneumonia and presumed COVID-19 infection  History of present illness   68 year old male patient with history as mentioned below, initially presented to his PCP office on 3/19 With complaint of flulike symptoms, this apparently started around 3/15 and initially presented as headache.  On 3/19 the headache returned, he also had diffuse body aches and pains, a new cough, nasal congestion and sore throat.  The symptoms continued to worsen.  He reported no recent travel history and no known history of of sick contacts at that point.  His influenza a and B were checked and these were negative.  He returned to his primary care provider on 3/20.  At this point he continued to have headache, productive cough and developed new fever of 100.2.  He denied shortness of breath at that time.  He was diagnosed with a left lower lobe pneumonia by physical exam, and ordered chest x-ray.  Chest x-ray obtained was negative for acute disease did show some chronic scarring in the right upper lobe. 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  3/26 developed acute hypoxia requiring escalating oxygen & eventually intubated.   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 COVID-19 sent given possible  exposure.  3/24 worsening resp failure. Intubated. transferred to [facility] long hospital following intubation  Consults:    Procedures:  Intubation 3/26  Significant Diagnostic Tests:  3l23 LDH: 634, CRP 133.    Micro Data:  Influenza A/B 3/20: neg Respiratory culture 3/26 Blood culture 3/26 COVID-19 3/23>>>  Antimicrobials:  Azithromycin 3/23>>> Rocephin 3/23>>>   Interim history/subjective:  Arrived to [facility] on full vent support  Objective   There were no vitals taken for this visit. PAP: ()/()      No intake or output data in the 24 hours ending 10/09/2018 70 There were no vitals filed for this visit.  Examination: General: Elderly man, orally intubated, intermittent coughing, sedated on propofol drip HENT: No pallor, mild icterus Lungs: Decreased breath sounds bilateral, no rhonchi Cardiovascular: S1-S2 tacky, narrow complex on monitor Abdomen: Soft, nontender Extremities: Cool extremities, mild scaling Neuro: Sedated, RA SS -2 GU: Clear urine  Resolved Hospital Problem list     Assessment & Plan:  Acute hypoxic respiratory failure in the setting of community-acquired pneumonia presumed COVID-19 infection based on history Portable chest x-ray personally reviewed: BILateral airspace disease with extensive consolidation Plan PCXR NOW (post transport) Sputum culture F/u COVID -19 Day 4 of azith and rocephin Full vent support w/ ARDS protocol, started PEEP of 10 and titrate PAD protocol RASS goal -2 Avoid additional aerosol procedures  VAP bundle COVID-19 droplet and contact isolation ? Adding hydroxychloroquine or chloroquine?    Sirs/sepsis Plan Cont IVFs Cont tele  abx as above    History of stage III  chronic kidney disease -baseline cr 1.56 to 1.7 Plan Avoid hypotension Serial chemistries  Renal dose meds  NAG metabolic acidosis Plan F/u chemistry in am   History of hypertension Plan Hold antihypertensives  History of diabetes type  2 Plan ssi   History of major depression Plan Holding Lyrica and Remeron  Best practice:  Diet: NPO Pain/Anxiety/Delirium protocol (if indicated): 3/26 VAP protocol (if indicated): 3/26 DVT prophylaxis: Rembrandt heparin GI prophylaxis: PPI daily Glucose control: SSI Mobility: BR Code Status: full code  Family Communication: pending  Disposition:   Labs   CBC: No results for input(s): WBC, NEUTROABS, HGB, HCT, MCV, PLT in the last 168 hours.  Basic Metabolic Panel: No results for input(s): NA, K, CL, CO2, GLUCOSE, BUN, CREATININE, CALCIUM, MG, PHOS in the last 168 hours. GFR: CrCl cannot be calculated (No successful lab value found.). No results for input(s): PROCALCITON, WBC, LATICACIDVEN in the last 168 hours.  Liver Function Tests: No results for input(s): AST, ALT, ALKPHOS, BILITOT, PROT, ALBUMIN in the last 168 hours. No results for input(s): LIPASE, AMYLASE in the last 168 hours. No results for input(s): AMMONIA in the last 168 hours.  ABG No results found for: PHART, PCO2ART, PO2ART, HCO3, TCO2, ACIDBASEDEF, O2SAT   Coagulation Profile: No results for input(s): INR, PROTIME in the last 168 hours.  Cardiac Enzymes: No results for input(s): CKTOTAL, CKMB, CKMBINDEX, TROPONINI in the last 168 hours.  HbA1C: No results found for: HGBA1C  CBG: No results for input(s): GLUCAP in the last 168 hours.  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 Morphine     REACTION: itch     Home Medications  Prior to Admission medications   Not on File     Critical care time: 60 minutes.  The treatment and management of the patient's condition was required  based on the threat of imminent deterioration. This time reflects time spent by the physician evaluating, providing care and managing the critically ill patient's care. The time was spent at the immediate bedside (or on the same floor/unit and dedicated to this patient's care). Time involved in separately billable procedures is NOT included int he critical care time indicated above. Family meeting and update time may be included above if and only if the patient is unable/incompetent to participate in clinical interview and/or decision making, and the discussion was necessary to determining treatment decisions  Cyril Mourning MD. Tonny Bollman. [facility] Pager [phone] If no response call [phone]   10/08/2018

## 2018-10-24 ENCOUNTER — Inpatient Hospital Stay (HOSPITAL_COMMUNITY): Payer: Medicare Other

## 2018-10-24 DIAGNOSIS — Z20828 Contact with and (suspected) exposure to other viral communicable diseases: Secondary | ICD-10-CM

## 2018-10-24 DIAGNOSIS — Z9911 Dependence on respirator [ventilator] status: Secondary | ICD-10-CM

## 2018-10-24 DIAGNOSIS — J80 Acute respiratory distress syndrome: Secondary | ICD-10-CM

## 2018-10-24 LAB — BASIC METABOLIC PANEL
ANION GAP: 9 (ref 5–15)
BUN: 21 mg/dL (ref 8–23)
CO2: 15 mmol/L — ABNORMAL LOW (ref 22–32)
Calcium: 7.5 mg/dL — ABNORMAL LOW (ref 8.9–10.3)
Chloride: 114 mmol/L — ABNORMAL HIGH (ref 98–111)
Creatinine, Ser: 1.93 mg/dL — ABNORMAL HIGH (ref 0.61–1.24)
GFR calc Af Amer: 40 mL/min — ABNORMAL LOW (ref >60)
GFR calc non Af Amer: 35 mL/min — ABNORMAL LOW (ref >60)
Glucose, Bld: 128 mg/dL — ABNORMAL HIGH (ref 70–99)
Potassium: 4.1 mmol/L (ref 3.5–5.1)
Sodium: 138 mmol/L (ref 135–145)

## 2018-10-24 LAB — BLOOD GAS, ARTERIAL
Acid-base deficit: 8.2 mmol/L — ABNORMAL HIGH (ref 0.0–2.0)
Bicarbonate: 16.8 mmol/L — ABNORMAL LOW (ref 20.0–28.0)
Drawn by: 422461
FIO2: 60
MECHVT: 510 mL
O2 Saturation: 95.3 %
PATIENT TEMPERATURE: 99
PEEP: 12 cmH2O
RATE: 22 resp/min
pCO2 arterial: 35.1 mmHg (ref 32.0–48.0)
pH, Arterial: 7.304 — ABNORMAL LOW (ref 7.350–7.450)
pO2, Arterial: 82.9 mmHg — ABNORMAL LOW (ref 83.0–108.0)

## 2018-10-24 LAB — GLUCOSE, CAPILLARY
Glucose-Capillary: 111 mg/dL — ABNORMAL HIGH (ref 70–99)
Glucose-Capillary: 112 mg/dL — ABNORMAL HIGH (ref 70–99)
Glucose-Capillary: 135 mg/dL — ABNORMAL HIGH (ref 70–99)
Glucose-Capillary: 87 mg/dL (ref 70–99)
Glucose-Capillary: 93 mg/dL (ref 70–99)
Glucose-Capillary: 98 mg/dL (ref 70–99)

## 2018-10-24 LAB — CBC
HCT: 37.4 % — ABNORMAL LOW (ref 39.0–52.0)
Hemoglobin: 12.1 g/dL — ABNORMAL LOW (ref 13.0–17.0)
MCH: 31.8 pg (ref 26.0–34.0)
MCHC: 32.4 g/dL (ref 30.0–36.0)
MCV: 98.2 fL (ref 80.0–100.0)
NRBC: 0 % (ref 0.0–0.2)
Platelets: 159 10*3/uL (ref 150–400)
RBC: 3.81 MIL/uL — ABNORMAL LOW (ref 4.22–5.81)
RDW: 14.5 % (ref 11.5–15.5)
WBC: 6.1 10*3/uL (ref 4.0–10.5)

## 2018-10-24 LAB — HIV ANTIBODY (ROUTINE TESTING W REFLEX): HIV Screen 4th Generation wRfx: NONREACTIVE

## 2018-10-24 LAB — MAGNESIUM: Magnesium: 1.7 mg/dL (ref 1.7–2.4)

## 2018-10-24 LAB — PHOSPHORUS: Phosphorus: 3.4 mg/dL (ref 2.5–4.6)

## 2018-10-24 MED ORDER — ZINC SULFATE 220 (50 ZN) MG PO CAPS
220.0000 mg | ORAL_CAPSULE | Freq: Every day | ORAL | Status: DC
  Administered 2018-10-24 – 2018-10-27 (×4): 220 mg
  Filled 2018-10-24 (×4): qty 1

## 2018-10-24 MED ORDER — HYDROXYCHLOROQUINE SULFATE 200 MG PO TABS
400.0000 mg | ORAL_TABLET | Freq: Two times a day (BID) | ORAL | Status: AC
  Administered 2018-10-24 (×2): 400 mg
  Filled 2018-10-24 (×2): qty 2

## 2018-10-24 MED ORDER — HYDROXYCHLOROQUINE SULFATE 200 MG PO TABS
200.0000 mg | ORAL_TABLET | Freq: Two times a day (BID) | ORAL | Status: AC
  Administered 2018-10-25 – 2018-10-28 (×8): 200 mg
  Filled 2018-10-24 (×8): qty 1

## 2018-10-24 MED ORDER — PANTOPRAZOLE SODIUM 40 MG PO PACK
40.0000 mg | PACK | ORAL | Status: DC
  Administered 2018-10-24 – 2018-10-31 (×8): 40 mg
  Filled 2018-10-24 (×8): qty 20

## 2018-10-24 MED ORDER — VITAL HIGH PROTEIN PO LIQD: 1000.0000 mL | ORAL | Status: DC

## 2018-10-24 MED ORDER — PRO-STAT SUGAR FREE PO LIQD
30.0000 mL | Freq: Every day | ORAL | Status: DC
  Administered 2018-10-24 – 2018-10-28 (×5): 30 mL
  Filled 2018-10-24 (×5): qty 30

## 2018-10-24 MED ORDER — PRO-STAT SUGAR FREE PO LIQD: 30.0000 mL | Freq: Two times a day (BID) | ORAL | Status: DC

## 2018-10-24 MED ORDER — ACETAMINOPHEN 160 MG/5ML PO SOLN
650.0000 mg | ORAL | Status: DC | PRN
  Administered 2018-10-24 – 2018-10-31 (×8): 650 mg
  Filled 2018-10-24 (×8): qty 20.3

## 2018-10-24 MED ORDER — ZINC SULFATE 220 (50 ZN) MG PO CAPS: 220.0000 mg | ORAL_CAPSULE | Freq: Two times a day (BID) | ORAL | Status: DC

## 2018-10-24 MED ORDER — VITAL AF 1.2 CAL PO LIQD
1000.0000 mL | ORAL | Status: DC
  Administered 2018-10-24 – 2018-10-25 (×3): 1000 mL
  Administered 2018-10-26: 1500 mL

## 2018-10-24 NOTE — Progress Notes (Addendum)
NUTRITION NOTE RD working remotely.   Consult received for TF initiation and management. Will order Vital AF 1.2 @ 60 ml/hr with 30 ml Prostat once/day. This regimen + kcal from current propofol rate will provide 2328 kcal (97% estimated kcal need), 123 grams of protein, and 1168 ml free water.  * Free water flush, if desired, to be per MD/NP.   RD will continue to follow per protocol.    Estimated Nutritional Needs:  Kcal:  2397 kcal Protein:  115-140 grams Fluid:  >/= 2.2 L/day   Trenton Gammon, MS, RD, LDN, [facility] Inpatient Clinical Dietitian Pager # [phone] After hours/weekend pager # [phone]

## 2018-10-24 NOTE — Progress Notes (Addendum)
Initial Nutrition Assessment  RD working remotely.   DOCUMENTATION CODES:   Not applicable  INTERVENTION:  - If patient to remain intubated >/= 24 hours and TF able to be initiated, recommend Vital AF 1.2 @ 60 ml/hr with 30 ml Prostat once/day.  - This regimen + kcal from current propofol rate will provide 2328 kcal (97% estimated kcal need), 123 grams of protein, and 1168 ml free water.   NUTRITION DIAGNOSIS:   Inadequate oral intake related to inability to eat as evidenced by NPO status.  GOAL:   Patient will meet greater than or equal to 90% of their needs  MONITOR:   Vent status, Weight trends, Labs, I & O's  REASON FOR ASSESSMENT:   Ventilator  ASSESSMENT:   68 year old male with hx of CKD stage 3, Type 2 DM, HTN, and depression. Patient was admitted 3/26 with acute hypoxic respiratory failure in the setting of community-acquired pneumonia and presumed COVID-19 infection. Intubated prior to transfer to [facility].  BMI indicates overweight status. Patient is intubated with OGT to LIS. RN flow sheet indicates 200 ml output at 6:00 AM today. Per review of chart, current weight is 207 lb, weight yesterday recorded as 199 lb, and weight on 3/20 at [facility] recorded as 203 lb.   Per Dr. Reginia Naas note/H&P yesterday afternoon: acute hypoxic respiratory failure in the setting of CAP with presumed COVID, r/o COVID, ARDS protocol, SIRS, sepsis, metabolic acidosis.  Patient is currently intubated on ventilator support MV: 10.7 L/min (per flow sheet, at 7:45 AM). Temp (24hrs), Avg:101.5 F (38.6 C), Min:99.8 F (37.7 C), Max:103.5 F (39.7 C) Propofol: 18.94 ml/hr as of 9 AM (500 kcal)   Medications reviewed; sliding scale novolog. Labs reviewed; Cl: 114 mmol/l, creatinine: 1.93 mg/dl, Ca: 7.5 mg/dl, GFR: 35 ml/min. IVF; LR @ 75 ml/hr. Drips as of 9 AM; propofol @ 35 mcg/kg/min, levo @ 5 mcg/min, fentanyl @ 100 mcg/hr, nimbex @ 3.5 mcg/kg/min.     NUTRITION - FOCUSED  PHYSICAL EXAM:  Unable to complete at this time.  Diet Order:   Diet Order            Diet NPO time specified  Diet effective now              EDUCATION NEEDS:   No education needs have been identified at this time  Skin:  Skin Assessment: Reviewed RN Assessment  Last BM:  PTA/unknown  Height:   Ht Readings from Last 1 Encounters:  November 05, 2018 5\' 10"  (1.778 m)    Weight:   Wt Readings from Last 1 Encounters:  10/24/18 94 kg    Ideal Body Weight:  75.45 kg  BMI:  Body mass index is 29.73 kg/m.  Estimated Nutritional Needs:   Kcal:  2397 kcal  Protein:  115-140 grams  Fluid:  >/= 2.2 L/day     Trenton Gammon, MS, RD, LDN, [facility] Inpatient Clinical Dietitian Pager # [phone] After hours/weekend pager # [phone]

## 2018-10-24 NOTE — Progress Notes (Addendum)
NAME:  Jack Bowers, MRN:  [id number], DOB:  15-Apr-1951, LOS: 1 ADMISSION DATE:  Nov 17, 2018, CONSULTATION DATE:  3/26 REFERRING MD:  N/A, CHIEF COMPLAINT:  Acute hypoxic respiratory failure   Brief History   68 year old male patient admitted 3/26 with acute hypoxic respiratory failure in the setting of community-acquired pneumonia and presumed COVID-19 infection   Past Medical History  CKD stage III, DM type II, HTN, major depression, migraines, h/o pleural plaque due to asbestos exposure, sleep apnea   Significant Hospital Events   3/23: Presented to [facility], started on azith and rocephin. COVID-19 sent given possible exposure.  3/26 worsening resp failure. Intubated. transferred to [facility] following intubation. PICC placed RUE.  3/27: no critical events overnight. Remains of ARDS protocol, CXR a little worse. Renal fxn has declines some. Placed on pressors overnight. Transducing CVP  Consults:    Procedures:  Intubation 3/26  Significant Diagnostic Tests:  3l23 LDH: 634, CRP 133.    Micro Data:  Influenza A/B 3/20: neg Respiratory culture 3/26 Blood culture 3/26 COVID-19 3/23>>>  Antimicrobials:  Azithromycin 3/23>>> Rocephin 3/23>>>  Interim history/subjective:  No events over night   Objective   Blood pressure (Abnormal) 94/58, pulse (Abnormal) 118, temperature (Abnormal) 103.5 F (39.7 C), temperature source Oral, resp. rate (Abnormal) 22, height 5\' 10"  (1.778 m), weight 94 kg, SpO2 93 %.    Vent Mode: PRVC FiO2 (%):  [55 %-100 %] 55 % Set Rate:  [20 bmp-22 bmp] 22 bmp Vt Set:  [510 mL-580 mL] 510 mL PEEP:  [8 cmH20-12 cmH20] 12 cmH20 Plateau Pressure:  [19 cmH20-25 cmH20] 25 cmH20   Intake/Output Summary (Last 24 hours) at 10/24/2018 0936 Last data filed at 10/24/2018 0900 Gross per 24 hour  Intake 1192.83 ml  Output 870 ml  Net 322.83 ml   Filed Weights   November 17, 2018 1500 17-Nov-2018 1520 10/24/18 0500  Weight: 90.2 kg 90.2 kg 94 kg    Examination: General intubated, sedated and on NMB RASS -5 HENT NCAT PERRL Card: HR reg no MRG Pul bilat rhonchi w/out wheeze or rales Abs soft not tender Ext nml, no rash or edema  Neuro On NMB   Resolved Hospital Problem list     Assessment & Plan:  Acute hypoxic respiratory failure in the setting of community-acquired pneumonia presumed COVID-19 infection based on history pcxr personally reviewed as of 3/27: Persistent bilateral right greater than left airspace disease.  Aeration is worse compared to prior film.  Endotracheal tube is in satisfactory position as is the right PICC line Plan Continue ARDS protocol Titrate PEEP/FiO2 per protocol Follow-up sputum culture We will touch base with [facility] later today, need to follow-up COVID-19 PAD protocol, RAS goal -2 to -3 VAP bundle If COVID-19 positive will consider chloroquine  Day # 5 azithromycin and rocephin. Will cont azithro for now given COVID risk and plan for 8d rocephin.   Septic shock w/ on-going fever Plan Cont MIVFs CVP goal 8-12 Titrate norepi for MAP > 65 abx per above Holding any antihypertensives and diuretics Cont tele  PRN tylenol  Acute on chronic renal failure 2/2 sepsis w/ History of stage III chronic kidney disease -baseline cr 1.56 to 1.7; Cr worse  Plan Avoid hypotension CVP goal > 8 Strict I&O Renal dose meds Am chemistry   NAG metabolic acidosis in setting of Hypechloremia Plan Cont LR Add free water Am chemistry   History of diabetes type 2 -excellent glycemic control Plan Sliding scale insulin  History of  major depression Plan Holding Remeron and Lyrica  Best practice:  Diet:: tubefeeds 3/27 Pain/Anxiety/Delirium protocol (if indicated): 3/26 VAP protocol (if indicated): 3/26 DVT prophylaxis: Wilson heparin GI prophylaxis: PPI daily Glucose control: SSI Mobility: BR Code Status: full code  Family Communication: pending  Disposition:     Critical care time: he remains  critically ill due to hypoxic respiratory failure requiring titration of PEEP/FIO2 and septic shock requiring evaluation of hemodynamic parameters and titration of hemodynamic gtts. Also w/ severe metabolic derangements requiring interpretation of blood chemistry.     10/24/2018   Attending portion:  Remains on vent with increased PEEP/FIO2, NMB.  BP (!) 94/58   Pulse (!) 118   Temp (!) 103.5 F (39.7 C) (Oral)   Resp (!) 22   Ht 5\' 10"  (1.778 m)   Wt 94 kg   SpO2 93%   BMI 29.73 kg/m   RASS -5.  Pupils reactive.  No edema.  HR regular.  Scattered rhonchi.  Abdomen soft.  CXR - b/l ASD (reviewed by me)  A/p  Acute hypoxic respiratory failure with ARDS from possible COVID 19 with recent sick exposure. - full vent support - PEEP/FIO2 to keep SpO2 88 to 95% - f/u CXR - day 5 of ABx - add hydroxychloroquine 3/27 due to very high probability he has COVID - day 2 of nimbex  Septic shock from above. - pressors to keep MAP > 65  Acute metabolic encephalopathy. - RASS goal - 5 while on nimbex  CC time by me independent of APP time 33 minutes  Coralyn Helling, MD [facility] Pulmonary/Critical Care 10/24/2018, 10:53 AM

## 2018-10-24 NOTE — Consult Note (Addendum)
[place] for Infectious Disease    Date of Admission:  10/26/2018   Total days of antibiotics: 4 ceftriaxone/azithro               Reason for Consult: COVID    Referring Provider: champ   Assessment: (suspected COVID) ARF AKI   Plan: 1. Await his COVID testing although this seems hi-prob 2. Continue azithro, ceftriaxone 3. Would continue azithro if ceftriaxone stopped as there are some reports it is synergistic with hydroxychloroquine.  4. Will continue to follow peripherally.  5. Spoke with [facility], test pending since 3-24  Thank you so much for this interesting consult,  Active Problems:   Acute respiratory failure with hypoxia ([place])   . artificial tears  1 application Both Eyes X4V  . chlorhexidine gluconate (MEDLINE KIT)  15 mL Mouth Rinse BID  . Chlorhexidine Gluconate Cloth  6 each Topical Daily  . feeding supplement (PRO-STAT SUGAR FREE 64)  30 mL Per Tube Daily  . fentaNYL (SUBLIMAZE) injection  100 mcg Intravenous Once  . heparin  5,000 Units Subcutaneous Q8H  . hydroxychloroquine  400 mg Per Tube BID   Followed by  . [START ON 10/25/2018] hydroxychloroquine  200 mg Per Tube BID  . insulin aspart  0-15 Units Subcutaneous Q4H  . mouth rinse  15 mL Mouth Rinse 10 times per day  . pantoprazole sodium  40 mg Per Tube Q24H  . sodium chloride flush  10-40 mL Intracatheter Q12H  . zinc sulfate  220 mg Per Tube Daily    HPI: Jack Bowers is a 68 y.o. male with hx of being part of a congregation which traveled to [place]. Several members have developed respiratory sx, one has also been transferred to [facility] with +COVID test.  Prior to adm, pt was seen by PCP 3-19 with ~ flu sx. Influenza (-). He developed worsening SOB and was adm on 3-23. He was started on ceftriaxone/azithro. He developed worsening SOB/O2 requirements and was intubated 3-26, transferred to [facility].   COVID test sent by [facility] 3-24. Sent to [place].   Resp Cx pending.   Review of  Systems: ROS  No past medical history on file.  Social History   Tobacco Use  . Smoking status: Not on file  Substance Use Topics  . Alcohol use: Not on file  . Drug use: Not on file    No family history on file.   Medications:  Scheduled: . artificial tears  1 application Both Eyes O5F  . chlorhexidine gluconate (MEDLINE KIT)  15 mL Mouth Rinse BID  . Chlorhexidine Gluconate Cloth  6 each Topical Daily  . feeding supplement (PRO-STAT SUGAR FREE 64)  30 mL Per Tube Daily  . fentaNYL (SUBLIMAZE) injection  100 mcg Intravenous Once  . heparin  5,000 Units Subcutaneous Q8H  . hydroxychloroquine  400 mg Per Tube BID   Followed by  . [START ON 10/25/2018] hydroxychloroquine  200 mg Per Tube BID  . insulin aspart  0-15 Units Subcutaneous Q4H  . mouth rinse  15 mL Mouth Rinse 10 times per day  . pantoprazole sodium  40 mg Per Tube Q24H  . sodium chloride flush  10-40 mL Intracatheter Q12H  . zinc sulfate  220 mg Per Tube Daily    Abtx:  Anti-infectives (From admission, onward)   Start     Dose/Rate Route Frequency Ordered Stop   10/25/18 1000  hydroxychloroquine (PLAQUENIL) tablet 200 mg  200 mg Per Tube 2 times daily 10/24/18 1055 10/29/18 0959   10/24/18 1300  cefTRIAXone (ROCEPHIN) 2 g in sodium chloride 0.9 % 100 mL IVPB     2 g 200 mL/hr over 30 Minutes Intravenous Every 24 hours 10/16/2018 1727     10/24/18 1200  hydroxychloroquine (PLAQUENIL) tablet 400 mg     400 mg Per Tube 2 times daily 10/24/18 1055 10/25/18 0959   10/24/18 1100  azithromycin (ZITHROMAX) 500 mg in sodium chloride 0.9 % 250 mL IVPB     500 mg 250 mL/hr over 60 Minutes Intravenous Every 24 hours 10/16/2018 1727          OBJECTIVE: Blood pressure 119/72, pulse (!) 101, temperature (!) 103.5 F (39.7 C), temperature source Oral, resp. rate (!) 22, height '5\' 10"'  (1.778 m), weight 94 kg, SpO2 93 %.  Physical Exam  Lab Results Results for orders placed or performed during the hospital encounter  of 09/28/2018 (from the past 48 hour(s))  Culture, respiratory (tracheal aspirate)     Status: None (Preliminary result)   Collection Time: 10/19/2018  3:34 PM  Result Value Ref Range   Specimen Description      TRACHEAL ASPIRATE Performed at [place] [address] [place], [place] [address]    Special Requests      NONE Performed at [facility], [place] [address], [place], [place] [address]    Gram Stain      ABUNDANT WBC PRESENT,BOTH PMN AND MONONUCLEAR NO ORGANISMS SEEN    Culture      CULTURE REINCUBATED FOR BETTER GROWTH Performed at [facility], [place] [address] [place], [place] [address]    Report Status PENDING   Draw ABG 1 hour after initiation of ventilator     Status: Abnormal   Collection Time: 10/20/2018  5:20 PM  Result Value Ref Range   FIO2 100.00    Delivery systems VENTILATOR    Mode PRESSURE REGULATED VOLUME CONTROL    VT 510 mL   LHR 22 resp/min   Peep/cpap 8.0 cm H20   pH, Arterial 7.304 (L) 7.350 - 7.450   pCO2 arterial 31.6 (L) 32.0 - 48.0 mmHg   pO2, Arterial 107 83.0 - 108.0 mmHg   Bicarbonate 15.2 (L) 20.0 - 28.0 mmol/L   Acid-base deficit 9.7 (H) 0.0 - 2.0 mmol/L   O2 Saturation 97.4 %   Patient temperature 98.6    Collection site LEFT RADIAL    Drawn by [id number]    Sample type ARTERIAL DRAW    Allens test (pass/fail) PASS PASS    Comment: Performed at [facility], [place] [address], [place], [place] [address]  Glucose, capillary     Status: None   Collection Time: 10/05/2018  7:52 PM  Result Value Ref Range   Glucose-Capillary 87 70 - 99 mg/dL  HIV antibody (Routine Testing)     Status: None   Collection Time: 10/24/2018  8:41 PM  Result Value Ref Range   HIV Screen 4th Generation wRfx Non Reactive Non Reactive    Comment: (NOTE) Performed At: [facility] [place], [place] [id number] Zaman Farmer MD [id number]   CBC     Status: Abnormal   Collection  Time: 10/07/2018  8:41 PM  Result Value Ref Range   WBC 5.9 4.0 - 10.5 K/uL   RBC 3.81 (L) 4.22 - 5.81 MIL/uL   Hemoglobin 12.0 (L) 13.0 - 17.0 g/dL   HCT  36.5 (L) 39.0 - 52.0 %   MCV 95.8 80.0 - 100.0 fL   MCH 31.5 26.0 - 34.0 pg   MCHC 32.9 30.0 - 36.0 g/dL   RDW 14.2 11.5 - 15.5 %   Platelets 156 150 - 400 K/uL   nRBC 0.0 0.0 - 0.2 %    Comment: Performed at [facility], [place] [address], [place], [place] [address]  Creatinine, serum     Status: Abnormal   Collection Time: 10/26/2018  8:41 PM  Result Value Ref Range   Creatinine, Ser 1.57 (H) 0.61 - 1.24 mg/dL   GFR calc non Af Amer 44 (L) >60 mL/min   GFR calc Af Amer 51 (L) >60 mL/min    Comment: Performed at [facility], [place] [address], [place], [place] [address]  Triglycerides     Status: Abnormal   Collection Time: 10/10/2018  8:41 PM  Result Value Ref Range   Triglycerides 247 (H) <150 mg/dL    Comment: Performed at [facility], [place] [address], [place], [place] [address]  Blood gas, arterial     Status: Abnormal   Collection Time: 10/13/2018  8:57 PM  Result Value Ref Range   FIO2 100.00    Delivery systems VENTILATOR    Mode PRESSURE REGULATED VOLUME CONTROL    VT 510 mL   LHR 22 resp/min   Peep/cpap 12.0 cm H20   pH, Arterial 7.326 (L) 7.350 - 7.450   pCO2 arterial 32.8 32.0 - 48.0 mmHg   pO2, Arterial 132 (H) 83.0 - 108.0 mmHg   Bicarbonate 16.3 (L) 20.0 - 28.0 mmol/L   Acid-base deficit 7.8 (H) 0.0 - 2.0 mmol/L   O2 Saturation 98.2 %   Patient temperature 100.9    Collection site LEFT RADIAL    Drawn by [id number]    Sample type ARTERIAL DRAW    Allens test (pass/fail) PASS PASS    Comment: Performed at [facility], [place] [address], [place], [place] [address]  Glucose, capillary     Status: None   Collection Time: 10/24/18 12:52 AM  Result Value Ref Range   Glucose-Capillary 98 70 - 99 mg/dL  Blood gas, arterial     Status: Abnormal    Collection Time: 10/24/18  4:06 AM  Result Value Ref Range   FIO2 60.00    Delivery systems VENTILATOR    Mode PRESSURE REGULATED VOLUME CONTROL    VT 510 mL   LHR 22 resp/min   Peep/cpap 12.0 cm H20   pH, Arterial 7.304 (L) 7.350 - 7.450   pCO2 arterial 35.1 32.0 - 48.0 mmHg   pO2, Arterial 82.9 (L) 83.0 - 108.0 mmHg   Bicarbonate 16.8 (L) 20.0 - 28.0 mmol/L   Acid-base deficit 8.2 (H) 0.0 - 2.0 mmol/L   O2 Saturation 95.3 %   Patient temperature 99.0    Collection site LEFT RADIAL    Drawn by [id number]    Sample type ARTERIAL DRAW    Allens test (pass/fail) PASS PASS    Comment: Performed at [facility], [place] [address], [place], [place] [address]  CBC     Status: Abnormal   Collection Time: 10/24/18  5:37 AM  Result Value Ref Range   WBC 6.1 4.0 - 10.5 K/uL   RBC 3.81 (L) 4.22 - 5.81 MIL/uL   Hemoglobin 12.1 (L) 13.0 - 17.0 g/dL   HCT 37.4 (L) 39.0 - 52.0 %   MCV 98.2 80.0 -  100.0 fL   MCH 31.8 26.0 - 34.0 pg   MCHC 32.4 30.0 - 36.0 g/dL   RDW 14.5 11.5 - 15.5 %   Platelets 159 150 - 400 K/uL   nRBC 0.0 0.0 - 0.2 %    Comment: Performed at [facility], [place] [address], [place], [place] [address]  Basic metabolic panel     Status: Abnormal   Collection Time: 10/24/18  5:37 AM  Result Value Ref Range   Sodium 138 135 - 145 mmol/L   Potassium 4.1 3.5 - 5.1 mmol/L   Chloride 114 (H) 98 - 111 mmol/L   CO2 15 (L) 22 - 32 mmol/L   Glucose, Bld 128 (H) 70 - 99 mg/dL   BUN 21 8 - 23 mg/dL   Creatinine, Ser 1.93 (H) 0.61 - 1.24 mg/dL   Calcium 7.5 (L) 8.9 - 10.3 mg/dL   GFR calc non Af Amer 35 (L) >60 mL/min   GFR calc Af Amer 40 (L) >60 mL/min   Anion gap 9 5 - 15    Comment: Performed at [facility], [place] [address], [place], [place] [address]  Magnesium     Status: None   Collection Time: 10/24/18  5:37 AM  Result Value Ref Range   Magnesium 1.7 1.7 - 2.4 mg/dL    Comment: Performed at [facility], [place] [address], [place], [place] [address]  Phosphorus     Status: None   Collection Time: 10/24/18  5:37 AM  Result Value Ref Range   Phosphorus 3.4 2.5 - 4.6 mg/dL    Comment: Performed at [facility], [place] [address], [place], [place] [address]  Glucose, capillary     Status: Abnormal   Collection Time: 10/24/18  5:42 AM  Result Value Ref Range   Glucose-Capillary 112 (H) 70 - 99 mg/dL  Glucose, capillary     Status: None   Collection Time: 10/24/18  8:26 AM  Result Value Ref Range   Glucose-Capillary 93 70 - 99 mg/dL  Glucose, capillary     Status: Abnormal   Collection Time: 10/24/18 12:42 PM  Result Value Ref Range   Glucose-Capillary 135 (H) 70 - 99 mg/dL      Component Value Date/Time   SDES  10/17/2018 1534    TRACHEAL ASPIRATE Performed at [facility], [place] [address] [place], [place] [address]    SPECREQUEST  10/05/2018 1534    NONE Performed at [facility], [place] [address] [place], [place] [address]    CULT  10/13/2018 1534    CULTURE REINCUBATED FOR BETTER GROWTH Performed at [facility], [place] [address], [place], [place] [address]    REPTSTATUS PENDING 10/07/2018 1534   Dg Chest Port 1 View  Result Date: 10/24/2018 CLINICAL DATA:  ARDS.  Intubation. EXAM: PORTABLE CHEST 1 VIEW COMPARISON:  003/09/202010 FINDINGS: Endotracheal tube in good position. NG tube enters the stomach. Interval placement of right-sided PICC with the tip at the cavoatrial junction. Extensive bilateral airspace disease right greater than left is unchanged. Heart size normal. No pleural effusion. No pneumothorax. IMPRESSION: Right arm PICC tip at the cavoatrial junction. Endotracheal tube remains in good position. Severe diffuse bilateral airspace disease unchanged. Electronically Signed   By: Franchot Gallo M.D.   On: 10/24/2018 07:12   Dg Chest Port 1 View  Result Date: 10/22/2018 CLINICAL DATA:  Acute hypoxic  respiratory failure. EXAM: PORTABLE CHEST 1 VIEW COMPARISON:  10/22/2018 at 9:25 a.m. FINDINGS: Allowing for differences in patient positioning and technique, there has been no significant change in the bilateral areas of airspace lung consolidation. No new lung abnormalities. No convincing pleural effusion and no pneumothorax. Endotracheal tube tip projects 3.1 cm above the [place]. Nasal/orogastric tube passes below the diaphragm into the mid stomach. IMPRESSION: 1. No significant change from the exam performed earlier today. 2. Persistent bilateral areas of lung space opacity consistent with multifocal pneumonia. 3. Stable, well-positioned support apparatus. Electronically Signed   By: Lajean Manes M.D.   On: 10/21/2018 16:45   [place] Ekg Site Rite  Result Date: 10/17/2018 If Site Rite image not attached, placement could not be confirmed due to current cardiac rhythm.  Recent Results (from the past 240 hour(s))  Culture, respiratory (tracheal aspirate)     Status: None (Preliminary result)   Collection Time: 10/10/2018  3:34 PM  Result Value Ref Range Status   Specimen Description   Final    TRACHEAL ASPIRATE Performed at [place] [address], [place], [place] [address]    Special Requests   Final    NONE Performed at [facility], [place] [address], [place], [place] [address]    Gram Stain   Final    ABUNDANT WBC PRESENT,BOTH PMN AND MONONUCLEAR NO ORGANISMS SEEN    Culture   Final    CULTURE REINCUBATED FOR BETTER GROWTH Performed at [facility], [place] [address], [place], [place] [address]    Report Status PENDING  Incomplete    Microbiology: Recent Results (from the past 240 hour(s))  Culture, respiratory (tracheal aspirate)     Status: None (Preliminary result)   Collection Time: 09/29/2018  3:34 PM  Result Value Ref Range Status   Specimen Description   Final    TRACHEAL ASPIRATE Performed at [place]  [address] [place], [place] [address]    Special Requests   Final    NONE Performed at [facility], [place] [address] [place], [place] [address]    Gram Stain   Final    ABUNDANT WBC PRESENT,BOTH PMN AND MONONUCLEAR NO ORGANISMS SEEN    Culture   Final    CULTURE REINCUBATED FOR BETTER GROWTH Performed at [facility], [place] [address] [place], [place] [address]    Report Status PENDING  Incomplete    Radiographs and labs were personally reviewed by me.   Bobby Rumpf, MD [facility] for [facility] [place] [facility] [phone] 10/24/2018, 3:49 PM

## 2018-10-25 ENCOUNTER — Inpatient Hospital Stay (HOSPITAL_COMMUNITY): Payer: Medicare Other

## 2018-10-25 LAB — BASIC METABOLIC PANEL
Anion gap: 9 (ref 5–15)
Anion gap: 11 (ref 5–15)
BUN: 34 mg/dL — ABNORMAL HIGH (ref 8–23)
BUN: 38 mg/dL — ABNORMAL HIGH (ref 8–23)
CALCIUM: 6.8 mg/dL — AB (ref 8.9–10.3)
CO2: 17 mmol/L — ABNORMAL LOW (ref 22–32)
CO2: 13 mmol/L — ABNORMAL LOW (ref 22–32)
Calcium: 7.6 mg/dL — ABNORMAL LOW (ref 8.9–10.3)
Chloride: 114 mmol/L — ABNORMAL HIGH (ref 98–111)
Chloride: 114 mmol/L — ABNORMAL HIGH (ref 98–111)
Creatinine, Ser: 3.93 mg/dL — ABNORMAL HIGH (ref 0.61–1.24)
Creatinine, Ser: 3.71 mg/dL — ABNORMAL HIGH (ref 0.61–1.24)
GFR calc Af Amer: 17 mL/min — ABNORMAL LOW (ref >60)
GFR calc Af Amer: 18 mL/min — ABNORMAL LOW (ref >60)
GFR calc non Af Amer: 15 mL/min — ABNORMAL LOW (ref >60)
GFR, EST NON AFRICAN AMERICAN: 16 mL/min — AB (ref >60)
Glucose, Bld: 116 mg/dL — ABNORMAL HIGH (ref 70–99)
Glucose, Bld: 159 mg/dL — ABNORMAL HIGH (ref 70–99)
Potassium: 3.9 mmol/L (ref 3.5–5.1)
Potassium: 4.1 mmol/L (ref 3.5–5.1)
SODIUM: 140 mmol/L (ref 135–145)
Sodium: 138 mmol/L (ref 135–145)

## 2018-10-25 LAB — GLUCOSE, CAPILLARY
GLUCOSE-CAPILLARY: 82 mg/dL (ref 70–99)
Glucose-Capillary: 77 mg/dL (ref 70–99)
Glucose-Capillary: 119 mg/dL — ABNORMAL HIGH (ref 70–99)
Glucose-Capillary: 104 mg/dL — ABNORMAL HIGH (ref 70–99)
Glucose-Capillary: 125 mg/dL — ABNORMAL HIGH (ref 70–99)
Glucose-Capillary: 184 mg/dL — ABNORMAL HIGH (ref 70–99)
Glucose-Capillary: 163 mg/dL — ABNORMAL HIGH (ref 70–99)

## 2018-10-25 LAB — CBC
HCT: 37.5 % — ABNORMAL LOW (ref 39.0–52.0)
Hemoglobin: 11.9 g/dL — ABNORMAL LOW (ref 13.0–17.0)
MCH: 30.8 pg (ref 26.0–34.0)
MCHC: 31.7 g/dL (ref 30.0–36.0)
MCV: 97.2 fL (ref 80.0–100.0)
Platelets: 206 10*3/uL (ref 150–400)
RBC: 3.86 MIL/uL — ABNORMAL LOW (ref 4.22–5.81)
RDW: 14.6 % (ref 11.5–15.5)
WBC: 5.4 10*3/uL (ref 4.0–10.5)
nRBC: 0 % (ref 0.0–0.2)

## 2018-10-25 LAB — MAGNESIUM: Magnesium: 2.1 mg/dL (ref 1.7–2.4)

## 2018-10-25 LAB — PHOSPHORUS: Phosphorus: 4.4 mg/dL (ref 2.5–4.6)

## 2018-10-25 MED ORDER — SODIUM CHLORIDE 0.9 % IV BOLUS
500.0000 mL | Freq: Once | INTRAVENOUS | Status: AC
  Administered 2018-10-25: 500 mL via INTRAVENOUS

## 2018-10-25 MED ORDER — ORAL CARE MOUTH RINSE: 15.0000 mL | OROMUCOSAL | Status: DC

## 2018-10-25 MED ORDER — CHLORHEXIDINE GLUCONATE 0.12% ORAL RINSE (MEDLINE KIT): 15.0000 mL | Freq: Two times a day (BID) | OROMUCOSAL | Status: DC

## 2018-10-25 NOTE — Progress Notes (Signed)
NAME:  Jack Bowers, MRN:  [id number], DOB:  [birth year], LOS: 2 ADMISSION DATE:  05-Nov-2018, CONSULTATION DATE:  3/26 REFERRING MD:  3/26, CHIEF COMPLAINT:  Acute respiratory failure with hypoxemia   Brief History   68 y/o male 3/26 with hypoxemia in setting of severe CAP, positive COVID 19 infection.    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 COVID-19 sent given possible exposure.  3/26 worsening resp failure. Intubated. transferred to [facility] long hospital following intubation. PICC placed RUE.  3/27: no critical events overnight. Remains of ARDS protocol, CXR a little worse. Renal fxn has declines some. Placed on pressors overnight. Transducing CVP  Consults:    Procedures:  Intubation 3/26  Significant Diagnostic Tests:  3l23 LDH: 634, CRP 133.    Micro Data:  Influenza A/B 3/20: neg Respiratory culture 3/26 Blood culture 3/26 COVID-19 3/23>>>  Antimicrobials:  Azithromycin 3/23>>> Rocephin 3/23>>>   Interim history/subjective:  As above  Objective   Blood pressure (!) 60/42, pulse 78, temperature 98.6 F (37 C), temperature source Oral, resp. rate (!) 30, height 5\' 10"  (1.778 m), weight 94.5 kg, SpO2 92 %.    Vent Mode: PRVC FiO2 (%):  [50 %-100 %] 80 % Set Rate:  [22 bmp-30 bmp] 30 bmp Vt Set:  [510 mL] 510 mL PEEP:  [10 cmH20-16 cmH20] 16 cmH20 Plateau Pressure:  [23 cmH20] 23 cmH20   Intake/Output Summary (Last 24 hours) at 10/25/2018 1549 Last data filed at 10/25/2018 1400 Gross per 24 hour  Intake 4360.21 ml  Output 900 ml  Net 3460.21 ml   Filed Weights   11/05/2018 1520 10/24/18 0500 10/25/18 0500  Weight: 90.2 kg 94 kg 94.5 kg    Examination:  General:  In bed on vent HENT: NCAT ETT in place PULM: CTA B, vent supported breathing CV: RRR,  no mgr GI: BS+, soft, nontender MSK: normal bulk and tone Neuro: sedated on vent    Resolved Hospital Problem list     Assessment & Plan:  ARDS: still with severe hypoxemia > change to ARDS protocol > continue nimbex > continue fentanyl/versed > titrate PEEP/FiO2 per protocol for Sa O2 > 88%  COVID 19 > continue plaquenil and azithro  Septic shock > resolved  AKI > worse > Monitor BMET and UOP > Replace electrolytes as needed > bolus IV  DM2 > continue SSI   Best practice:  Diet: tube feeding Pain/Anxiety/Delirium protocol (if indicated): yes VAP protocol (if indicated): yes DVT prophylaxis: sub q hep GI prophylaxis: ppi Glucose control: ssi Mobility: bed rest Code Status: full Family Communication: called spouse Britta Mccreedy on 3/28   Disposition: remain in ICU  Labs   CBC: Recent Labs  Lab 11-05-2018 2041 10/24/18 0537 10/25/18 0429  WBC 5.9 6.1 5.4  HGB 12.0* 12.1* 11.9*  HCT 36.5* 37.4* 37.5*  MCV 95.8 98.2 97.2  PLT 156 159 206    Basic Metabolic Panel: Recent Labs  Lab 05-Nov-2018 2041 10/24/18 0537 10/25/18 0429  NA  --  138 140  K  --  4.1 3.9  CL  --  114* 114*  CO2  --  15* 17*  GLUCOSE  --  128* 116*  BUN  --  21 34*  CREATININE 1.57* 1.93* 3.93*  CALCIUM  --  7.5* 7.6*  MG  --  1.7 2.1  PHOS  --  3.4 4.4   GFR: Estimated Creatinine Clearance: 20.5 mL/min (A) (by C-G formula based on SCr of 3.93 mg/dL (H)). Recent Labs  Lab 10/22/2018 2041 10/24/18 0537 10/25/18 0429  WBC 5.9 6.1 5.4    Liver Function Tests: No results for input(s): AST, ALT, ALKPHOS, BILITOT, PROT, ALBUMIN in the last 168 hours. No results for input(s): LIPASE, AMYLASE in the last 168 hours. No results for input(s): AMMONIA in the last 168 hours.  ABG    Component Value Date/Time   PHART 7.304 (L) 10/24/2018 0406   PCO2ART 35.1 10/24/2018 0406   PO2ART 82.9 (L) 10/24/2018 0406   HCO3 16.8 (L) 10/24/2018 0406   ACIDBASEDEF 8.2 (H) 10/24/2018 0406   O2SAT  95.3 10/24/2018 0406     Coagulation Profile: No results for input(s): INR, PROTIME in the last 168 hours.  Cardiac Enzymes: No results for input(s): CKTOTAL, CKMB, CKMBINDEX, TROPONINI in the last 168 hours.  HbA1C: No results found for: HGBA1C  CBG: Recent Labs  Lab 10/24/18 1600 10/24/18 2020 10/25/18 0015 10/25/18 0437 10/25/18 0841  GLUCAP 111* 77 82 119* 104*    Review of Systems:   Cannot obtain  Past Medical History  He,  has no past medical history on file.   Surgical History      Social History      Family History   His family history is not on file.   Allergies Allergies  Allergen Reactions  . Hydrocodone-Acetaminophen Itching  . Morphine     REACTION: itch  . Cyclobenzaprine Rash  . Venlafaxine Rash     Home Medications  Prior to Admission medications   Medication Sig Start Date End Date Taking? Authorizing Provider  clonazePAM (KLONOPIN) 2 MG tablet    Yes [provider]  benzonatate (TESSALON) 200 MG capsule     [provider]  topiramate (TOPAMAX) 50 MG tablet     [provider]  triamcinolone cream (KENALOG) 0.1 %     [provider]     Critical care time: 40 minutes    Heber Berry, MD [place] [facility] Pager: [phone] Cell: [phone] If no response, call [phone]

## 2018-10-26 DIAGNOSIS — J22 Unspecified acute lower respiratory infection: Secondary | ICD-10-CM

## 2018-10-26 DIAGNOSIS — N179 Acute kidney failure, unspecified: Secondary | ICD-10-CM

## 2018-10-26 DIAGNOSIS — B9729 Other coronavirus as the cause of diseases classified elsewhere: Secondary | ICD-10-CM

## 2018-10-26 LAB — BLOOD GAS, ARTERIAL
Acid-base deficit: 9.9 mmol/L — ABNORMAL HIGH (ref 0.0–2.0)
BICARBONATE: 15.2 mmol/L — AB (ref 20.0–28.0)
Drawn by: 331471
FIO2: 80
MECHVT: 570 mL
O2 Saturation: 96.8 %
PEEP: 16 cmH2O
Patient temperature: 98.6
RATE: 16 resp/min
pCO2 arterial: 31.9 mmHg — ABNORMAL LOW (ref 32.0–48.0)
pH, Arterial: 7.298 — ABNORMAL LOW (ref 7.350–7.450)
pO2, Arterial: 95.8 mmHg (ref 83.0–108.0)

## 2018-10-26 LAB — BASIC METABOLIC PANEL
Anion gap: 10 (ref 5–15)
BUN: 48 mg/dL — ABNORMAL HIGH (ref 8–23)
CO2: 15 mmol/L — ABNORMAL LOW (ref 22–32)
Calcium: 7.6 mg/dL — ABNORMAL LOW (ref 8.9–10.3)
Chloride: 116 mmol/L — ABNORMAL HIGH (ref 98–111)
Creatinine, Ser: 4.45 mg/dL — ABNORMAL HIGH (ref 0.61–1.24)
GFR calc Af Amer: 15 mL/min — ABNORMAL LOW (ref >60)
GFR calc non Af Amer: 13 mL/min — ABNORMAL LOW (ref >60)
Glucose, Bld: 162 mg/dL — ABNORMAL HIGH (ref 70–99)
Potassium: 4 mmol/L (ref 3.5–5.1)
Sodium: 141 mmol/L (ref 135–145)

## 2018-10-26 LAB — GLUCOSE, CAPILLARY
GLUCOSE-CAPILLARY: 158 mg/dL — AB (ref 70–99)
GLUCOSE-CAPILLARY: 173 mg/dL — AB (ref 70–99)
Glucose-Capillary: 145 mg/dL — ABNORMAL HIGH (ref 70–99)
Glucose-Capillary: 131 mg/dL — ABNORMAL HIGH (ref 70–99)
Glucose-Capillary: 140 mg/dL — ABNORMAL HIGH (ref 70–99)

## 2018-10-26 LAB — TRIGLYCERIDES: Triglycerides: 564 mg/dL — ABNORMAL HIGH (ref <150)

## 2018-10-26 MED ORDER — CHLORHEXIDINE GLUCONATE CLOTH 2 % EX PADS
6.0000 | MEDICATED_PAD | Freq: Every day | CUTANEOUS | Status: DC
  Administered 2018-10-26 – 2018-10-31 (×6): 6 via TOPICAL

## 2018-10-26 MED ORDER — SODIUM CHLORIDE 0.9 % IV SOLN
2.0000 g | INTRAVENOUS | Status: DC
  Filled 2018-10-26 (×2): qty 20

## 2018-10-26 NOTE — Progress Notes (Signed)
NAME:  Jack Bowers, MRN:  [id number], DOB:  [birth year], LOS: 3 ADMISSION DATE:  10/25/2018, CONSULTATION DATE:  3/26 REFERRING MD:  3/26, CHIEF COMPLAINT:  Acute respiratory failure with hypoxemia   Brief History   68 y/o male 3/26 with hypoxemia in setting of severe CAP, positive COVID 19 infection.    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 COVID-19 sent given possible exposure.  3/26 worsening resp failure. Intubated. transferred to [facility] long hospital following intubation. PICC placed RUE.  3/27: no critical events overnight. Remains of ARDS protocol, CXR a little worse. Renal fxn has declines some. Placed on pressors overnight. Transducing CVP  Consults:    Procedures:  Intubation 3/26  Significant Diagnostic Tests:  3l23 LDH: 634, CRP 133.   Micro Data:  Influenza A/B 3/20: neg Respiratory culture 3/26 Blood culture 3/26 COVID-19 3/23>>> POSTIVE  Antimicrobials:  Azithromycin 3/23>>> Rocephin 3/23>>> 3/29 Plaquenil 3/28 >    Interim history/subjective:  Remains critically ill, renal function worse Acidosis OK  Objective   Blood pressure 125/68, pulse (!) 107, temperature 97.9 F (36.6 C), temperature source Oral, resp. rate (!) 30, height 5\' 10"  (1.778 m), weight 94.5 kg, SpO2 93 %.    Vent Mode: PRVC FiO2 (%):  [60 %-100 %] 60 % Set Rate:  [22 bmp-30 bmp] 30 bmp Vt Set:  [510 mL] 510 mL PEEP:  [10 cmH20-16 cmH20] 16 cmH20 Plateau Pressure:  [32 cmH20] 32 cmH20   Intake/Output Summary (Last 24 hours) at 10/26/2018 1224 Last data filed at 10/26/2018 0800 Gross per 24 hour  Intake 5721.51 ml  Output 850 ml  Net 4871.51 ml   Filed Weights   09/29/2018 1520 10/24/18 0500 10/25/18 0500  Weight: 90.2 kg 94 kg 94.5 kg    Examination:  General:  In  bed on vent HENT: NCAT ETT in place PULM: CTA B, vent supported breathing CV: RRR, no mgr GI: BS+, soft, nontender MSK: normal bulk and tone Neuro: sedated on vent    Resolved Hospital Problem list   Septic shock  Assessment & Plan:  ARDS: still with severe hypoxemia > continue ARDS protocol > try to stop nimbex today > continue high dose sedation > titrate FiO2/PEEP for SaO2 > 88%  COVID 19  > continue plaquenil and azithromycin > given multi-organ failure, very high likelihood of death, and lack of availability of Actemra will not administer that drug  Hypotension related to sedation > continue levophed for MAP > 65  AKI > shock related, Creatinine worse, not oliguric, not acidemic, no other acute indications for HD > stop IV fluids > Monitor BMET and UOP > Replace electrolytes as needed > will likely need HD on 3/30  DM2 > continue SSI   Best practice:  Diet: tube feeding Pain/Anxiety/Delirium protocol (if indicated): yes VAP protocol (if indicated): yes DVT prophylaxis: sub q hep GI prophylaxis: ppi Glucose control: ssi Mobility: bed rest Code Status: full Family Communication: called spouse Britta Mccreedy on 3/29  Disposition: remain in ICU  Labs   CBC: Recent Labs  Lab 10/03/2018 2041 10/24/18 0537 10/25/18 0429  WBC 5.9 6.1 5.4  HGB 12.0* 12.1* 11.9*  HCT 36.5* 37.4* 37.5*  MCV 95.8 98.2 97.2  PLT 156 159 206    Basic Metabolic Panel: Recent Labs  Lab 10/15/2018  2041 10/24/18 0537 10/25/18 0429 10/25/18 1756 10/26/18 0539  NA  --  138 140 138 141  K  --  4.1 3.9 4.1 4.0  CL  --  114* 114* 114* 116*  CO2  --  15* 17* 13* 15*  GLUCOSE  --  128* 116* 159* 162*  BUN  --  21 34* 38* 48*  CREATININE 1.57* 1.93* 3.93* 3.71* 4.45*  CALCIUM  --  7.5* 7.6* 6.8* 7.6*  MG  --  1.7 2.1  --   --   PHOS  --  3.4 4.4  --   --    GFR: Estimated Creatinine Clearance: 18.1 mL/min (A) (by C-G formula based on SCr of 4.45 mg/dL (H)). Recent Labs  Lab  10/06/2018 2041 10/24/18 0537 10/25/18 0429  WBC 5.9 6.1 5.4    Liver Function Tests: No results for input(s): AST, ALT, ALKPHOS, BILITOT, PROT, ALBUMIN in the last 168 hours. No results for input(s): LIPASE, AMYLASE in the last 168 hours. No results for input(s): AMMONIA in the last 168 hours.  ABG    Component Value Date/Time   PHART 7.298 (L) 10/26/2018 0855   PCO2ART 31.9 (L) 10/26/2018 0855   PO2ART 95.8 10/26/2018 0855   HCO3 15.2 (L) 10/26/2018 0855   ACIDBASEDEF 9.9 (H) 10/26/2018 0855   O2SAT 96.8 10/26/2018 0855     Coagulation Profile: No results for input(s): INR, PROTIME in the last 168 hours.  Cardiac Enzymes: No results for input(s): CKTOTAL, CKMB, CKMBINDEX, TROPONINI in the last 168 hours.  HbA1C: No results found for: HGBA1C  CBG: Recent Labs  Lab 10/25/18 1615 10/25/18 2103 10/26/18 0034 10/26/18 0534 10/26/18 0840  GLUCAP 184* 163* 158* 145* 131*    Review of Systems:   Cannot obtain  Past Medical History  He,  has no past medical history on file.   Surgical History      Social History      Family History   His family history is not on file.   Allergies Allergies  Allergen Reactions  . Hydrocodone-Acetaminophen Itching  . Morphine     REACTION: itch  . Cyclobenzaprine Rash  . Venlafaxine Rash     Home Medications  Prior to Admission medications   Medication Sig Start Date End Date Taking? Authorizing Provider  clonazePAM (KLONOPIN) 2 MG tablet    Yes [provider]  benzonatate (TESSALON) 200 MG capsule     [provider]  topiramate (TOPAMAX) 50 MG tablet     [provider]  triamcinolone cream (KENALOG) 0.1 %     [provider]     Critical care time: 30 minutes    Heber [place], MD [place] [facility] Pager: [phone] Cell: [phone] If no response, call [phone]

## 2018-10-26 NOTE — Progress Notes (Signed)
COVID+ patient.  Resp Cx "reincubated for better growth" Will restart anbx, ceftriaxone. Will avoid vanco with Cr increased, avoid zyvox with other QT prolonging agents. Watch Cx prior to adding MRSA agent.  Watch QT on azitho, plaquenil

## 2018-10-26 NOTE — Progress Notes (Signed)
[facility]  COVID 19 test confirmed positive  Heber [place], MD [place] [facility] Pager: [phone] Cell: [phone] If no response, call [phone]

## 2018-10-26 NOTE — Progress Notes (Signed)
Jack Bowers' covid 19 testing is positive from [place] [facility] dept[facility]state. Please Continue on airborne contact isolation. If any visitors, they must be masked

## 2018-10-27 ENCOUNTER — Inpatient Hospital Stay (HOSPITAL_COMMUNITY): Payer: Medicare Other

## 2018-10-27 DIAGNOSIS — L8990 Pressure ulcer of unspecified site, unspecified stage: Secondary | ICD-10-CM

## 2018-10-27 LAB — BASIC METABOLIC PANEL
Anion gap: 10 (ref 5–15)
BUN: 60 mg/dL — ABNORMAL HIGH (ref 8–23)
CALCIUM: 7.3 mg/dL — AB (ref 8.9–10.3)
CO2: 15 mmol/L — ABNORMAL LOW (ref 22–32)
CREATININE: 4.38 mg/dL — AB (ref 0.61–1.24)
Chloride: 117 mmol/L — ABNORMAL HIGH (ref 98–111)
GFR calc Af Amer: 15 mL/min — ABNORMAL LOW (ref >60)
GFR calc non Af Amer: 13 mL/min — ABNORMAL LOW (ref >60)
Glucose, Bld: 198 mg/dL — ABNORMAL HIGH (ref 70–99)
Potassium: 4.2 mmol/L (ref 3.5–5.1)
Sodium: 142 mmol/L (ref 135–145)

## 2018-10-27 LAB — GLUCOSE, CAPILLARY
Glucose-Capillary: 149 mg/dL — ABNORMAL HIGH (ref 70–99)
Glucose-Capillary: 147 mg/dL — ABNORMAL HIGH (ref 70–99)
Glucose-Capillary: 250 mg/dL — ABNORMAL HIGH (ref 70–99)
Glucose-Capillary: 157 mg/dL — ABNORMAL HIGH (ref 70–99)
Glucose-Capillary: 187 mg/dL — ABNORMAL HIGH (ref 70–99)
Glucose-Capillary: 188 mg/dL — ABNORMAL HIGH (ref 70–99)

## 2018-10-27 LAB — CULTURE, RESPIRATORY W GRAM STAIN: Culture: NORMAL

## 2018-10-27 LAB — CBC
HCT: UNDETERMINED % (ref 39.0–52.0)
HCT: 37.2 % — ABNORMAL LOW (ref 39.0–52.0)
Hemoglobin: UNDETERMINED g/dL (ref 13.0–17.0)
Hemoglobin: 12.2 g/dL — ABNORMAL LOW (ref 13.0–17.0)
MCH: UNDETERMINED pg (ref 26.0–34.0)
MCH: 31.7 pg (ref 26.0–34.0)
MCHC: UNDETERMINED g/dL (ref 30.0–36.0)
MCHC: 32.8 g/dL (ref 30.0–36.0)
MCV: UNDETERMINED fL (ref 80.0–100.0)
MCV: 96.6 fL (ref 80.0–100.0)
PLATELETS: 283 10*3/uL (ref 150–400)
Platelets: UNDETERMINED 10*3/uL (ref 150–400)
RBC: UNDETERMINED MIL/uL (ref 4.22–5.81)
RBC: 3.85 MIL/uL — ABNORMAL LOW (ref 4.22–5.81)
RDW: UNDETERMINED % (ref 11.5–15.5)
RDW: 15.6 % — ABNORMAL HIGH (ref 11.5–15.5)
WBC: UNDETERMINED 10*3/uL (ref 4.0–10.5)
WBC: 10.6 10*3/uL — AB (ref 4.0–10.5)
nRBC: UNDETERMINED % (ref 0.0–0.2)
nRBC: 0 % (ref 0.0–0.2)

## 2018-10-27 LAB — COMPREHENSIVE METABOLIC PANEL
ALBUMIN: 2.2 g/dL — AB (ref 3.5–5.0)
ALT: 23 U/L (ref 0–44)
AST: 42 U/L — ABNORMAL HIGH (ref 15–41)
Alkaline Phosphatase: 84 U/L (ref 38–126)
Anion gap: 12 (ref 5–15)
BUN: 60 mg/dL — ABNORMAL HIGH (ref 8–23)
CALCIUM: 7.7 mg/dL — AB (ref 8.9–10.3)
CHLORIDE: 115 mmol/L — AB (ref 98–111)
CO2: 15 mmol/L — ABNORMAL LOW (ref 22–32)
Creatinine, Ser: 4.63 mg/dL — ABNORMAL HIGH (ref 0.61–1.24)
GFR calc Af Amer: 14 mL/min — ABNORMAL LOW (ref >60)
GFR calc non Af Amer: 12 mL/min — ABNORMAL LOW (ref >60)
GLUCOSE: 164 mg/dL — AB (ref 70–99)
Potassium: 4.1 mmol/L (ref 3.5–5.1)
Sodium: 142 mmol/L (ref 135–145)
Total Bilirubin: 0.4 mg/dL (ref 0.3–1.2)
Total Protein: 6.1 g/dL — ABNORMAL LOW (ref 6.5–8.1)

## 2018-10-27 LAB — CULTURE, RESPIRATORY

## 2018-10-27 MED ORDER — HYDRALAZINE HCL 20 MG/ML IJ SOLN: 10.0000 mg | INTRAMUSCULAR | Status: DC | PRN

## 2018-10-27 MED ORDER — MIDAZOLAM BOLUS VIA INFUSION
2.0000 mg | INTRAVENOUS | Status: DC | PRN
  Filled 2018-10-27: qty 2

## 2018-10-27 MED ORDER — CLONAZEPAM 1 MG PO TABS
1.0000 mg | ORAL_TABLET | Freq: Two times a day (BID) | ORAL | Status: DC
  Administered 2018-10-27 – 2018-10-28 (×4): 1 mg via ORAL
  Filled 2018-10-27 (×5): qty 1

## 2018-10-27 MED ORDER — SODIUM CHLORIDE 0.9 % IV SOLN
2.0000 mg/h | INTRAVENOUS | Status: DC
  Administered 2018-10-27 – 2018-10-28 (×4): 5 mg/h via INTRAVENOUS
  Filled 2018-10-27 (×4): qty 10

## 2018-10-27 MED ORDER — PROPOFOL 1000 MG/100ML IV EMUL: 25.0000 ug/kg/min | INTRAVENOUS | Status: DC

## 2018-10-27 MED ORDER — MIDAZOLAM HCL 2 MG/2ML IJ SOLN: 2.0000 mg | Freq: Once | INTRAMUSCULAR | Status: DC | PRN

## 2018-10-27 MED ORDER — MIDAZOLAM HCL 2 MG/2ML IJ SOLN: 2.0000 mg | Freq: Once | INTRAMUSCULAR | Status: DC

## 2018-10-27 MED ORDER — ZINC SULFATE 220 (50 ZN) MG PO CAPS
220.0000 mg | ORAL_CAPSULE | Freq: Every day | ORAL | Status: AC
  Administered 2018-10-28: 220 mg
  Filled 2018-10-27: qty 1

## 2018-10-27 MED ORDER — SODIUM CHLORIDE 0.9 % IV SOLN
100.0000 ug/h | INTRAVENOUS | Status: DC
  Administered 2018-10-27 – 2018-10-28 (×2): 125 ug/h via INTRAVENOUS
  Filled 2018-10-27 (×2): qty 50

## 2018-10-27 MED ORDER — DILTIAZEM HCL 100 MG IV SOLR
5.0000 mg/h | INTRAVENOUS | Status: DC
  Administered 2018-10-27 – 2018-10-28 (×2): 5 mg/h via INTRAVENOUS
  Administered 2018-10-29: 10 mg/h via INTRAVENOUS
  Administered 2018-10-29: 7.5 mg/h via INTRAVENOUS
  Administered 2018-10-30: 5 mg/h via INTRAVENOUS
  Administered 2018-10-30: 7.5 mg/h via INTRAVENOUS
  Filled 2018-10-27 (×7): qty 100

## 2018-10-27 NOTE — Progress Notes (Signed)
Called emergently to bedside after self extubation  Pt being bagged with sats in 60s. Emergently intubated Took a long time for sats to pick up.  Chilton Greathouse MD [place] [facility] 10/27/2018, 6:51 PM

## 2018-10-27 NOTE — Progress Notes (Signed)
Pt's Resp Cx is normal flora Will stop ceftriaxone Cr slightly worsened CXR worsening.

## 2018-10-27 NOTE — Progress Notes (Signed)
Pt noted to be audibly gurgling around ET tube, O2 sats 84-88%, RT called and notified. ET tube noted to be at 20cm at lip.  Dr Isaiah Serge on unit and called to room, Pts son Zohan notified.

## 2018-10-27 NOTE — Progress Notes (Signed)
eLink Physician-Brief Progress Note Patient Name: Jack Bowers DOB: 05/01/1951 MRN: [id number]   Date of Service  10/27/2018  HPI/Events of Note  AFIB with RVR - Ventricular Rate  = 143. BP = 121/75 with MAP = 83.   eICU Interventions  Will order: 1. 12 Lead EKG now. 2. Diltiazem IV infusion. Titrate to HR = 65-105.  3. BMP and Mg++ level STAT.      Intervention Category Major Interventions: Arrhythmia - evaluation and management  Sommer,Steven Eugene 10/27/2018, 11:07 PM

## 2018-10-27 NOTE — Progress Notes (Signed)
NAME:  Jack Bowers, MRN:  [id number], DOB:  Nov 23, 1950, LOS: 4 ADMISSION DATE:  20-Nov-2018, CONSULTATION DATE:  3/26 REFERRING MD:  3/26, CHIEF COMPLAINT:  Acute respiratory failure with hypoxemia   Brief History   68 y/o male 3/26 with hypoxemia in setting of severe CAP, positive COVID 19 infection.   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 COVID-19 sent given possible exposure.  3/26 worsening resp failure. Intubated. transferred to [facility] long hospital following intubation. PICC placed RUE.  3/27 no critical events overnight. Remains of ARDS protocol, CXR a little worse. Renal fxn has declined some. Placed on pressors overnight. Transducing CVP  3/30 Remains on high PEEP / FiO2  Consults:    Procedures:  Intubation 3/26  Significant Diagnostic Tests:  3l23 LDH: 634, CRP 133.  Micro Data:  Influenza A/B 3/20: neg Respiratory culture 3/26 Blood culture 3/26 COVID-19 3/23 >> POSTIVE  Antimicrobials:  Azithromycin 3/23 >> Rocephin 3/23 >> 3/29 Plaquenil 3/28 >>   Interim history/subjective:  RN reports triglycerides >500, pt remains on Nimbex / propofol / fentanyl.  Pressors weaned off, now hypertensive.    Objective   Blood pressure (!) 183/79, pulse 97, temperature (!) 100.5 F (38.1 C), resp. rate (!) 30, height 5\' 10"  (1.778 m), weight 99.8 kg, SpO2 95 %.    Vent Mode: PRVC FiO2 (%):  [60 %-70 %] 70 % Set Rate:  [30 bmp] 30 bmp Vt Set:  [510 mL] 510 mL PEEP:  [16 cmH20] 16 cmH20 Plateau Pressure:  [32 cmH20] 32 cmH20   Intake/Output Summary (Last 24 hours) at 10/27/2018 0855 Last data filed at 10/27/2018 0700 Gross per 24 hour  Intake 3343.03 ml  Output 1100 ml  Net 2243.03 ml   Filed Weights   10/25/18 0500 10/26/18 0600 10/27/18 0500  Weight: 94.5 kg  98.3 kg 99.8 kg    Examination: General: critically ill appearing adult male lying in bed in NAD  HEENT: MM pink/moist, ETT Neuro: sedate, paralyzed  CV: s1s2 rrr, no m/r/g PULM: even/non-labored, lungs bilaterally coarse FG:HWEX, non-tender, bsx4 active  Extremities: warm/dry, no edema  Skin: BLE changes consistent with venous stasis   Resolved Hospital Problem list   Septic shock / Hypotension - component of sedation  Assessment & Plan:   ARDS -paO2/FiO2 ratio 120 P: Continue ARDS protocol, low Vt ventilation  Reduce fiO2 to 60% on next entry to room  Transition to versed gtt from propofol given triglycerides  Add klonopin 1mg  BID PT  Follow intermittent CXR  COVID 19  P: Continue plaquenil + azithro  Multi-organ failure, high likelihood of death / poor prognosis   Hypertension P: Add PRN hydralazine for SBP > 160  AKI  -shock related, Creatinine worse, not oliguric, not acidemic, no other acute indications for HD P: Trend BMP / urinary output Replace electrolytes as indicated Avoid nephrotoxic agents, ensure adequate renal perfusion Hold HD at this point, making urine, not acidemic on prior gas  DM2 P: SSI    Best practice:  Diet: TF Pain/Anxiety/Delirium protocol (if indicated): yes VAP protocol (if indicated): yes DVT prophylaxis: sub q hep GI prophylaxis: ppi Glucose control: ssi Mobility: bed rest Code Status: full Family Communication: called wife 3/30, no answer.  Message left for return call.  Disposition: ICU  Labs   CBC:  Recent Labs  Lab 10/02/2018 2041 10/24/18 0537 10/25/18 0429 10/27/18 0455 10/27/18 0614  WBC 5.9 6.1 5.4 SPECIMEN CONTAMINATED, UNABLE TO PERFORM TEST(S). 10.6*  HGB 12.0* 12.1* 11.9* SPECIMEN CONTAMINATED, UNABLE TO PERFORM TEST(S). 12.2*  HCT 36.5* 37.4* 37.5* SPECIMEN CONTAMINATED, UNABLE TO PERFORM TEST(S). 37.2*  MCV 95.8 98.2 97.2 SPECIMEN CONTAMINATED, UNABLE TO PERFORM TEST(S). 96.6  PLT 156 159 206 SPECIMEN  CONTAMINATED, UNABLE TO PERFORM TEST(S). 283    Basic Metabolic Panel: Recent Labs  Lab 10/24/18 0537 10/25/18 0429 10/25/18 1756 10/26/18 0539 10/27/18 0614  NA 138 140 138 141 142  K 4.1 3.9 4.1 4.0 4.1  CL 114* 114* 114* 116* 115*  CO2 15* 17* 13* 15* 15*  GLUCOSE 128* 116* 159* 162* 164*  BUN 21 34* 38* 48* 60*  CREATININE 1.93* 3.93* 3.71* 4.45* 4.63*  CALCIUM 7.5* 7.6* 6.8* 7.6* 7.7*  MG 1.7 2.1  --   --   --   PHOS 3.4 4.4  --   --   --    GFR: Estimated Creatinine Clearance: 17.8 mL/min (A) (by C-G formula based on SCr of 4.63 mg/dL (H)). Recent Labs  Lab 10/24/18 0537 10/25/18 0429 10/27/18 0455 10/27/18 0614  WBC 6.1 5.4 SPECIMEN CONTAMINATED, UNABLE TO PERFORM TEST(S). 10.6*    Liver Function Tests: Recent Labs  Lab 10/27/18 0614  AST 42*  ALT 23  ALKPHOS 84  BILITOT 0.4  PROT 6.1*  ALBUMIN 2.2*   No results for input(s): LIPASE, AMYLASE in the last 168 hours. No results for input(s): AMMONIA in the last 168 hours.  ABG    Component Value Date/Time   PHART 7.298 (L) 10/26/2018 0855   PCO2ART 31.9 (L) 10/26/2018 0855   PO2ART 95.8 10/26/2018 0855   HCO3 15.2 (L) 10/26/2018 0855   ACIDBASEDEF 9.9 (H) 10/26/2018 0855   O2SAT 96.8 10/26/2018 0855     Coagulation Profile: No results for input(s): INR, PROTIME in the last 168 hours.  Cardiac Enzymes: No results for input(s): CKTOTAL, CKMB, CKMBINDEX, TROPONINI in the last 168 hours.  HbA1C: No results found for: HGBA1C  CBG: Recent Labs  Lab 10/26/18 0534 10/26/18 0840 10/26/18 1558 10/26/18 2035 10/27/18 0451  GLUCAP 145* 131* 173* 140* 149*    Critical care time: 30 minutes    Canary Brim, NP-C [place] [facility] Pgr: [phone] or if no answer [phone] 10/27/2018, 9:19 AM

## 2018-10-27 NOTE — Procedures (Signed)
Intubation Procedure Note Jack Bowers [id number] April 04, 1951  Procedure: Intubation Indications: Airway protection and maintenance  Procedure Details Consent: Risks of procedure as well as the alternatives and risks of each were explained to the (patient/caregiver).  Consent for procedure obtained. Time Out: Verified patient identification, verified procedure, site/side was marked, verified correct patient position, special equipment/implants available, medications/allergies/relevent history reviewed, required imaging and test results available.  Performed  Maximum sterile technique was used including antiseptics, cap, gloves, gown, hand hygiene and mask.  Glidescope attempt. 1 attempt  Evaluation Hemodynamic Status: BP stable throughout; O2 sats: currently acceptable Patient's Current Condition: stable Complications: No apparent complications Patient did tolerate procedure well. Chest X-ray ordered to verify placement.  CXR: pending.   Jack Bowers 10/27/2018

## 2018-10-28 ENCOUNTER — Inpatient Hospital Stay (HOSPITAL_COMMUNITY): Payer: Medicare Other

## 2018-10-28 LAB — BLOOD GAS, ARTERIAL
ACID-BASE DEFICIT: 14.4 mmol/L — AB (ref 0.0–2.0)
Acid-base deficit: 1.9 mmol/L (ref 0.0–2.0)
Bicarbonate: 16.3 mmol/L — ABNORMAL LOW (ref 20.0–28.0)
Bicarbonate: 26.9 mmol/L (ref 20.0–28.0)
DRAWN BY: 308601
Drawn by: 308601
FIO2: 100
FIO2: 100
MECHVT: 430 mL
MECHVT: 430 mL
O2 Saturation: 90.7 %
O2 Saturation: 90.3 %
PATIENT TEMPERATURE: 97.8
PCO2 ART: 59.9 mmHg — AB (ref 32.0–48.0)
PEEP: 16 cmH2O
PEEP: 16 cmH2O
PH ART: 7.062 — AB (ref 7.350–7.450)
PH ART: 7.218 — AB (ref 7.350–7.450)
Patient temperature: 98.6
RATE: 28 resp/min
RATE: 33 resp/min
pCO2 arterial: 68.2 mmHg (ref 32.0–48.0)
pO2, Arterial: 69.9 mmHg — ABNORMAL LOW (ref 83.0–108.0)
pO2, Arterial: 58.4 mmHg — ABNORMAL LOW (ref 83.0–108.0)

## 2018-10-28 LAB — URINALYSIS, ROUTINE W REFLEX MICROSCOPIC
Bilirubin Urine: NEGATIVE
Glucose, UA: NEGATIVE mg/dL
Ketones, ur: NEGATIVE mg/dL
Leukocytes,Ua: NEGATIVE
Nitrite: NEGATIVE
Protein, ur: 100 mg/dL — AB
Specific Gravity, Urine: 1.015 (ref 1.005–1.030)
pH: 5 (ref 5.0–8.0)

## 2018-10-28 LAB — BASIC METABOLIC PANEL
Anion gap: 10 (ref 5–15)
Anion gap: 10 (ref 5–15)
Anion gap: 13 (ref 5–15)
BUN: 81 mg/dL — ABNORMAL HIGH (ref 8–23)
BUN: 85 mg/dL — ABNORMAL HIGH (ref 8–23)
BUN: 73 mg/dL — AB (ref 8–23)
CHLORIDE: 107 mmol/L (ref 98–111)
CO2: 16 mmol/L — AB (ref 22–32)
CO2: 27 mmol/L (ref 22–32)
CO2: 16 mmol/L — AB (ref 22–32)
Calcium: 6.3 mg/dL — CL (ref 8.9–10.3)
Calcium: 7.9 mg/dL — ABNORMAL LOW (ref 8.9–10.3)
Calcium: 7.9 mg/dL — ABNORMAL LOW (ref 8.9–10.3)
Chloride: 118 mmol/L — ABNORMAL HIGH (ref 98–111)
Chloride: 118 mmol/L — ABNORMAL HIGH (ref 98–111)
Creatinine, Ser: 4.81 mg/dL — ABNORMAL HIGH (ref 0.61–1.24)
Creatinine, Ser: 5.03 mg/dL — ABNORMAL HIGH (ref 0.61–1.24)
Creatinine, Ser: 5.14 mg/dL — ABNORMAL HIGH (ref 0.61–1.24)
GFR calc Af Amer: 12 mL/min — ABNORMAL LOW (ref >60)
GFR calc Af Amer: 13 mL/min — ABNORMAL LOW (ref >60)
GFR calc Af Amer: 13 mL/min — ABNORMAL LOW (ref >60)
GFR calc non Af Amer: 11 mL/min — ABNORMAL LOW (ref >60)
GFR calc non Af Amer: 11 mL/min — ABNORMAL LOW (ref >60)
GFR calc non Af Amer: 11 mL/min — ABNORMAL LOW (ref >60)
GLUCOSE: 204 mg/dL — AB (ref 70–99)
Glucose, Bld: 238 mg/dL — ABNORMAL HIGH (ref 70–99)
Glucose, Bld: 341 mg/dL — ABNORMAL HIGH (ref 70–99)
Potassium: 3.5 mmol/L (ref 3.5–5.1)
Potassium: 5.6 mmol/L — ABNORMAL HIGH (ref 3.5–5.1)
Potassium: 5.8 mmol/L — ABNORMAL HIGH (ref 3.5–5.1)
Sodium: 144 mmol/L (ref 135–145)
Sodium: 144 mmol/L (ref 135–145)
Sodium: 147 mmol/L — ABNORMAL HIGH (ref 135–145)

## 2018-10-28 LAB — GLUCOSE, CAPILLARY
GLUCOSE-CAPILLARY: 232 mg/dL — AB (ref 70–99)
GLUCOSE-CAPILLARY: 300 mg/dL — AB (ref 70–99)
Glucose-Capillary: 170 mg/dL — ABNORMAL HIGH (ref 70–99)
Glucose-Capillary: 175 mg/dL — ABNORMAL HIGH (ref 70–99)
Glucose-Capillary: 298 mg/dL — ABNORMAL HIGH (ref 70–99)
Glucose-Capillary: 301 mg/dL — ABNORMAL HIGH (ref 70–99)
Glucose-Capillary: 314 mg/dL — ABNORMAL HIGH (ref 70–99)
Glucose-Capillary: 279 mg/dL — ABNORMAL HIGH (ref 70–99)

## 2018-10-28 LAB — CBC
HCT: 37.2 % — ABNORMAL LOW (ref 39.0–52.0)
HEMOGLOBIN: 11.8 g/dL — AB (ref 13.0–17.0)
MCH: 31.7 pg (ref 26.0–34.0)
MCHC: 31.7 g/dL (ref 30.0–36.0)
MCV: 100 fL (ref 80.0–100.0)
Platelets: 286 10*3/uL (ref 150–400)
RBC: 3.72 MIL/uL — ABNORMAL LOW (ref 4.22–5.81)
RDW: 16.8 % — ABNORMAL HIGH (ref 11.5–15.5)
WBC: 15.4 10*3/uL — ABNORMAL HIGH (ref 4.0–10.5)
nRBC: 0.3 % — ABNORMAL HIGH (ref 0.0–0.2)

## 2018-10-28 LAB — MAGNESIUM
Magnesium: 2.9 mg/dL — ABNORMAL HIGH (ref 1.7–2.4)
Magnesium: 3.2 mg/dL — ABNORMAL HIGH (ref 1.7–2.4)

## 2018-10-28 MED ORDER — SODIUM POLYSTYRENE SULFONATE 15 GM/60ML PO SUSP
30.0000 g | Freq: Once | ORAL | Status: AC
  Administered 2018-10-28: 30 g
  Filled 2018-10-28: qty 120

## 2018-10-28 MED ORDER — PRO-STAT SUGAR FREE PO LIQD
60.0000 mL | Freq: Two times a day (BID) | ORAL | Status: DC
  Administered 2018-10-28 – 2018-10-31 (×6): 60 mL
  Filled 2018-10-28 (×6): qty 60

## 2018-10-28 MED ORDER — NEPRO/CARBSTEADY PO LIQD
1000.0000 mL | ORAL | Status: DC
  Administered 2018-10-28 – 2018-10-30 (×3): 1000 mL
  Filled 2018-10-28 (×5): qty 1000

## 2018-10-28 MED ORDER — SODIUM BICARBONATE 8.4 % IV SOLN
INTRAVENOUS | Status: AC
  Administered 2018-10-28: 200 meq via INTRAVENOUS
  Filled 2018-10-28: qty 50

## 2018-10-28 MED ORDER — VECURONIUM BROMIDE 10 MG IV SOLR
0.8000 ug/kg/min | INTRAVENOUS | Status: DC
  Administered 2018-10-28: 1 ug/kg/min via INTRAVENOUS
  Administered 2018-10-29: 1.1 ug/kg/min via INTRAVENOUS
  Filled 2018-10-28 (×2): qty 100

## 2018-10-28 MED ORDER — INSULIN REGULAR(HUMAN) IN NACL 100-0.9 UT/100ML-% IV SOLN
INTRAVENOUS | Status: DC
  Administered 2018-10-28: 2.4 [IU]/h via INTRAVENOUS
  Filled 2018-10-28: qty 100

## 2018-10-28 MED ORDER — FENTANYL BOLUS VIA INFUSION
50.0000 ug | INTRAVENOUS | Status: DC | PRN
  Filled 2018-10-28: qty 50

## 2018-10-28 MED ORDER — FENTANYL 2500MCG IN NS 250ML (10MCG/ML) PREMIX INFUSION: 100.0000 ug/h | INTRAVENOUS | Status: DC

## 2018-10-28 MED ORDER — STERILE WATER FOR INJECTION IV SOLN
INTRAVENOUS | Status: DC
  Administered 2018-10-28 – 2018-10-31 (×5): via INTRAVENOUS
  Filled 2018-10-28 (×6): qty 850

## 2018-10-28 MED ORDER — FENTANYL 2500MCG IN NS 250ML (10MCG/ML) PREMIX INFUSION
100.0000 ug/h | INTRAVENOUS | Status: DC
  Administered 2018-10-29 – 2018-10-31 (×3): 100 ug/h via INTRAVENOUS
  Filled 2018-10-28 (×4): qty 250

## 2018-10-28 MED ORDER — FUROSEMIDE 10 MG/ML IJ SOLN
80.0000 mg | Freq: Once | INTRAMUSCULAR | Status: AC
  Administered 2018-10-28: 80 mg via INTRAVENOUS
  Filled 2018-10-28: qty 8

## 2018-10-28 MED ORDER — VECURONIUM BROMIDE 10 MG IV SOLR
0.8000 ug/kg/min | INTRAVENOUS | Status: DC
  Filled 2018-10-28 (×2): qty 100

## 2018-10-28 MED ORDER — MIDAZOLAM BOLUS VIA INFUSION
2.0000 mg | INTRAVENOUS | Status: DC | PRN
  Filled 2018-10-28: qty 2

## 2018-10-28 MED ORDER — ARTIFICIAL TEARS OPHTHALMIC OINT
1.0000 "application " | TOPICAL_OINTMENT | Freq: Three times a day (TID) | OPHTHALMIC | Status: DC
  Administered 2018-10-28 – 2018-10-29 (×2): 1 via OPHTHALMIC
  Filled 2018-10-28: qty 3.5

## 2018-10-28 MED ORDER — SODIUM BICARBONATE 8.4 % IV SOLN
200.0000 meq | Freq: Once | INTRAVENOUS | Status: AC
  Administered 2018-10-28: 200 meq via INTRAVENOUS
  Filled 2018-10-28: qty 100

## 2018-10-28 MED ORDER — SODIUM BICARBONATE 8.4 % IV SOLN
100.0000 meq | Freq: Once | INTRAVENOUS | Status: AC
  Administered 2018-10-28: 100 meq via INTRAVENOUS
  Filled 2018-10-28 (×2): qty 50

## 2018-10-28 MED ORDER — MIDAZOLAM 50MG/50ML (1MG/ML) PREMIX INFUSION
2.0000 mg/h | INTRAVENOUS | Status: DC
  Administered 2018-10-28: 5 mg/h via INTRAVENOUS
  Administered 2018-10-29 – 2018-10-30 (×2): 4 mg/h via INTRAVENOUS
  Filled 2018-10-28 (×7): qty 50

## 2018-10-28 MED ORDER — CALCIUM GLUCONATE-NACL 2-0.675 GM/100ML-% IV SOLN
2.0000 g | Freq: Once | INTRAVENOUS | Status: AC
  Administered 2018-10-28: 2000 mg via INTRAVENOUS
  Filled 2018-10-28 (×2): qty 100

## 2018-10-28 NOTE — Consult Note (Signed)
Renal Service Consult Note [facility]  Jack Bowers 10/28/2018 Sol [place] Requesting Physician:  Dr Vaughan Browner  Reason for Consult:  Acute on CRF HPI: The patient is a 68 y.o. year-old with hx of DM2, HTN, depression, CKD 3 admitted 3/23 to outside facility with 1 week hx of headaches, myalgias, cough, and uri symptoms, then developed SOB which was new and was admitted to the hospital. Some of his church members had traveled to [place] the week prior and COVID-19 infection was suspected. On 3/24 pt's resp status deteriorated and he was intubated and transferred to [facility] ICU. Pt has been on vent since w/ ARDS and difficulty oxygenating. He developed AKI with rising creat from 1.57 on admit to 5.14 this am w/ K 5.8.  Making urine.  We are asked to see for acute on CRF.    No old creat's in our system.  Care EVerywhere shows creat in 2019 1.5- 1.8 (eGFR 43- 52) range. Renal US from 2016 showed no hydro and normal sized kidneys.      Pt is intubated.    ROS N/a    Past Medical History No past medical history on file. Past Surgical History  Family History No family history on file. Social History  has no history on file for tobacco, alcohol, and drug. Allergies  Allergies  Allergen Reactions  . Hydrocodone-Acetaminophen Itching  . Morphine     REACTION: itch  . Cyclobenzaprine Rash  . Venlafaxine Rash   Home medications Prior to Admission medications   Medication Sig Start Date End Date Taking? Authorizing Provider  clonazePAM (KLONOPIN) 2 MG tablet    Yes [provider]  benzonatate (TESSALON) 200 MG capsule     [provider]  topiramate (TOPAMAX) 50 MG tablet     [provider]  triamcinolone cream (KENALOG) 0.1 %     [provider]   Liver Function Tests Recent Labs  Lab 10/27/18 0614  AST 42*  ALT 23  ALKPHOS 84  BILITOT 0.4  PROT 6.1*  ALBUMIN 2.2*   No results for input(s): LIPASE, AMYLASE in the  last 168 hours. CBC Recent Labs  Lab 10/27/18 0455 10/27/18 0614 10/28/18 0536  WBC SPECIMEN CONTAMINATED, UNABLE TO PERFORM TEST(S). 10.6* 15.4*  HGB SPECIMEN CONTAMINATED, UNABLE TO PERFORM TEST(S). 12.2* 11.8*  HCT SPECIMEN CONTAMINATED, UNABLE TO PERFORM TEST(S). 37.2* 37.2*  MCV SPECIMEN CONTAMINATED, UNABLE TO PERFORM TEST(S). 96.6 100.0  PLT SPECIMEN CONTAMINATED, UNABLE TO PERFORM TEST(S). 283 035   Basic Metabolic Panel Recent Labs  Lab 10/24/18 0537 10/25/18 0429 10/25/18 1756 10/26/18 0539 10/27/18 0614 10/27/18 1113 10/27/18 2342 10/28/18 0536 10/28/18 1800  NA 138 140 138 141 142 142 144 144 147*  K 4.1 3.9 4.1 4.0 4.1 4.2 5.6* 5.8* 3.5  CL 114* 114* 114* 116* 115* 117* 118* 118* 107  CO2 15* 17* 13* 15* 15* 15* 16* 16* 27  GLUCOSE 128* 116* 159* 162* 164* 198* 204* 238* 341*  BUN 21 34* 38* 48* 60* 60* 73* 81* 85*  CREATININE 1.93* 3.93* 3.71* 4.45* 4.63* 4.38* 5.03* 5.14* 4.81*  CALCIUM 7.5* 7.6* 6.8* 7.6* 7.7* 7.3* 7.9* 7.9* 6.3*  PHOS 3.4 4.4  --   --   --   --   --   --   --    Iron/TIBC/Ferritin/ %Sat No results found for: IRON, TIBC, FERRITIN, IRONPCTSAT  Vitals:   10/28/18 1700 10/28/18 1800 10/28/18 1900 10/28/18 2017  BP: (!) 140/55 (!) 137/52 Marland Kitchen)  124/52 (!) 117/51  Pulse: (!) 110 (!) 114 (!) 110 (!) 109  Resp: (!) 33 (!) 33 (!) 33 (!) 33  Temp:   (!) 101.5 F (38.6 C)   TempSrc:      SpO2: (!) 89% (!) 89% (!) 89% 90%  Weight:      Height:       Exam  Due to +COVID status direct exam not done, utilizing physical exam of CCM team.     I/O since 3/26 admit, 18L in and 7.8 L out, +10.8 L Wt on admit 90kg >> today 99.5kg    Home meds:  - clonazepam 34m/ topiramate 50 mg   - triamcinolone cream/ benzonatate 200 mg prn    Assessment: 1. AKI on CKD 3- baseline creat 1.6- 1.8.  No nephrotoxins.  Pt was on pressors for several days after admission here 3/26.  Suspect ATN, pt is non-oliguric.  Vol overloaded potentially w/ worsening of CXR,  would give IV lasix now that off pressors.  No strong indication for RRT yet.  Will follow.  2. COVID-19 infection 3. Resp failure - on vent, bilat infiltrates and /or edema 4. H/o DM2 not on any medications 5. HTN 6. H/o depression 7. HYperkalemia - getting lokelma today    Plan: 1. As above      [facility] 10/28/2018, 9:42 PM

## 2018-10-28 NOTE — Progress Notes (Signed)
Nutrition Follow-up  RD working remotely.   DOCUMENTATION CODES:   Not applicable  INTERVENTION:  - Will adjust TF regimen given hyperkalemia and hypermagnesemia. - Nepro @ 35 ml/hr with 60 ml prostat BID. This regimen will provide 1912 kcal, 128 grams protein, and 611 ml free water.  - Free water flush and renal-friendly multivitamin, if desired, to be per MD/NP.    NUTRITION DIAGNOSIS:   Inadequate oral intake related to inability to eat as evidenced by NPO status. -ongoing  GOAL:   Patient will meet greater than or equal to 90% of their needs -met with TF regimen  MONITOR:   Vent status, TF tolerance, Weight trends, Labs, Skin  ASSESSMENT:   68 year old male with hx of CKD stage 3, Type 2 DM, HTN, and depression. Patient was admitted 3/26 with acute hypoxic respiratory failure in the setting of community-acquired pneumonia and presumed COVID-19 infection. Intubated prior to transfer to [facility]  Weight trending up since admission. Weight from 3/20 at [facility] (92.1 kg) used to re-estimate kcal need as this is most consistent with weight at admission. Patient remains intubated with OGT in place. He is chemically paralyzed (nimbex). Order in place for Vital AF 1.2 @ 60 ml/hr with 30 ml Prostat once/day which provides 1828 kcal (94% re-estimated kcal need), 123 grams protein, and 1168 ml free water.   Confirmation of positive COVID-19 test received on 3/29.  Per [name]'s note yesterday: ARDS protocol, transitioned to versed from propofol d/t triglycerides (564 mg/dl on 3/29), multi-organ failure with poor prognosis, AKI--holding HD as patient making urine.   Patient is currently intubated on ventilator support MV: 14.2 L/min (as of 7:15 AM) Temp (24hrs), Avg:97.7 F (36.5 C), Min:97.7 F (36.5 C), Max:97.7 F (36.5 C) Propofol: none BP: 153/66 and MAP: 95 (as of 10:00 AM)   Medications reviewed; sliding scale novolog, 50 mEq sodium bicarb x2 doses 3/31, 30 g  kayexalate per OGT x2 doses 3/31, 220 mg zinc sulfate per OGT x1 dose 3/31. Labs reviewed; CBGs: 170 and 232 mg/dl today, K: 5.8 mmol/l, Cl: 118 mmol/l, BUN: 81 mg/dl, creatinine: 5.14 mg/dl, Ca: 7.9 mg/dl, Mg: 2.9 mg/dl, GFR: 12 ml/min. IVF; 150 mEq sodium bicarb in sterile water @ 75 ml/hr. Drips as of 7:00 AM; nimbex @ 3.5 mcg/kg/min, fentanyl @ 125 mcg/hr, versed 2 5 mg/hr.    Diet Order:   Diet Order            Diet NPO time specified  Diet effective now              EDUCATION NEEDS:   No education needs have been identified at this time  Skin:  Skin Assessment: Skin Integrity Issues: Skin Integrity Issues:: Stage II Stage II: coccyx (newly documented on 3/30)  Last BM:  3/31  Height:   Ht Readings from Last 1 Encounters:  09/29/2018 _0  (1.778 m)    Weight:   Wt Readings from Last 1 Encounters:  10/28/18 99.5 kg    Ideal Body Weight:  75.45 kg  BMI:  Body mass index is 31.47 kg/m.  Estimated Nutritional Needs:   Kcal:  1950 kcal  Protein:  115-140 grams  Fluid:  >/= 2.2 L/day     Jarome Matin, MS, RD, LDN, [facility] Inpatient Clinical Dietitian Pager # [phone] After hours/weekend pager # [phone]

## 2018-10-28 NOTE — Progress Notes (Signed)
eLink Physician-Brief Progress Note Patient Name: Jack Bowers DOB: [birth year] MRN: [id number]   Date of Service  10/28/2018  HPI/Events of Note  Hypokalemia - K+ = 3.5 and Creatinine = 4.81. Given hyperkalemia last evening, will not replace K+ at this time.   eICU Interventions  Continue present management.      Intervention Category Major Interventions: Electrolyte abnormality - evaluation and management  Sommer,Steven Eugene 10/28/2018, 8:16 PM

## 2018-10-28 NOTE — Progress Notes (Signed)
eLink Physician-Brief Progress Note Patient Name: Jack Bowers DOB: 11/08/1950 MRN: [id number]   Date of Service  10/28/2018  HPI/Events of Note  ABG on 100%/PRVC 28/TV 430/P 16 = 7.06/59/69/16.  eICU Interventions  Will order: 1. Increase PRVC rate to 33. 2. NaHCO3 200 meq IV now. 3. NaHCO3 IV infusion at 75 mL/hour.  4. Repeat ABG at 6 AM.     Intervention Category Major Interventions: Acid-Base disturbance - evaluation and management;Respiratory failure - evaluation and management  Sommer,Steven Dennard Nip 10/28/2018, 4:24 AM

## 2018-10-28 NOTE — Progress Notes (Signed)
Son returned call after talking with mother.  They are in agreement to continue current support but in the event of arrest, no CPR.    LCB placed.   Canary Brim, NP-C [facility] Pgr: [phone] or if no answer [phone] 10/28/2018, 2:30 PM

## 2018-10-28 NOTE — Consult Note (Deleted)
Renal Service Consult Note [facility]  Jack Bowers 10/28/2018 Sol [place]azing Requesting Physician:  Dr Vaughan Browner  Reason for Consult:  Acute on CRF HPI: The patient is a 68 y.o. year-old with hx of DM2, HTN, depression, CKD 3 admitted 3/23 to outside facility with 1 week hx of headaches, myalgias, cough, and uri symptoms, then developed SOB which was new and was admitted to the hospital. Some of his church members had traveled to [place] the week prior and COVID-19 infection was suspected. On 3/24 pt's resp status deteriorated and he was intubated and transferred to [facility] ICU. Pt has been on vent since w/ ARDS and difficulty oxygenating. He developed AKI with rising creat from 1.57 on admit to 5.14 this am w/ K 5.8.  Making urine.  We are asked to see for acute on CRF.    No old creat's in our system.  Care Everywhere shows creat in 2019 1.5- 1.8 (eGFR 43- 52) range. Renal US from 2016 showed no hydro and normal sized kidneys.      Pt is intubated.    ROS N/a    Past Medical History No past medical history on file. Past Surgical History The histories are not reviewed yet. Please review them in the "History" navigator section and refresh this [place]. Family History No family history on file. Social History  has no history on file for tobacco, alcohol, and drug. Allergies  Allergies  Allergen Reactions  . Hydrocodone-Acetaminophen Itching  . Morphine     REACTION: itch  . Cyclobenzaprine Rash  . Venlafaxine Rash   Home medications Prior to Admission medications   Medication Sig Start Date End Date Taking? Authorizing Provider  clonazePAM (KLONOPIN) 2 MG tablet    Yes [provider]  benzonatate (TESSALON) 200 MG capsule     [provider]  topiramate (TOPAMAX) 50 MG tablet     [provider]  triamcinolone cream (KENALOG) 0.1 %     [provider]   Liver Function Tests Recent Labs  Lab 10/27/18 0614  AST 42*   ALT 23  ALKPHOS 84  BILITOT 0.4  PROT 6.1*  ALBUMIN 2.2*   No results for input(s): LIPASE, AMYLASE in the last 168 hours. CBC Recent Labs  Lab 10/27/18 0455 10/27/18 0614 10/28/18 0536  WBC SPECIMEN CONTAMINATED, UNABLE TO PERFORM TEST(S). 10.6* 15.4*  HGB SPECIMEN CONTAMINATED, UNABLE TO PERFORM TEST(S). 12.2* 11.8*  HCT SPECIMEN CONTAMINATED, UNABLE TO PERFORM TEST(S). 37.2* 37.2*  MCV SPECIMEN CONTAMINATED, UNABLE TO PERFORM TEST(S). 96.6 100.0  PLT SPECIMEN CONTAMINATED, UNABLE TO PERFORM TEST(S). 283 657   Basic Metabolic Panel Recent Labs  Lab 10/24/18 0537 10/25/18 0429 10/25/18 1756 10/26/18 0539 10/27/18 0614 10/27/18 1113 10/27/18 2342 10/28/18 0536 10/28/18 1800  NA 138 140 138 141 142 142 144 144 147*  K 4.1 3.9 4.1 4.0 4.1 4.2 5.6* 5.8* 3.5  CL 114* 114* 114* 116* 115* 117* 118* 118* 107  CO2 15* 17* 13* 15* 15* 15* 16* 16* 27  GLUCOSE 128* 116* 159* 162* 164* 198* 204* 238* 341*  BUN 21 34* 38* 48* 60* 60* 73* 81* 85*  CREATININE 1.93* 3.93* 3.71* 4.45* 4.63* 4.38* 5.03* 5.14* 4.81*  CALCIUM 7.5* 7.6* 6.8* 7.6* 7.7* 7.3* 7.9* 7.9* 6.3*  PHOS 3.4 4.4  --   --   --   --   --   --   --    Iron/TIBC/Ferritin/ %Sat No results found for: IRON, TIBC, FERRITIN, IRONPCTSAT  Vitals:   10/28/18 1700 10/28/18 1800 10/28/18 1900 10/28/18 2017  BP: (!) 140/55 (!) 137/52 (!) 124/52 (!) 117/51  Pulse: (!) 110 (!) 114 (!) 110 (!) 109  Resp: (!) 33 (!) 33 (!) 33 (!) 33  Temp:   (!) 101.5 F (38.6 C)   TempSrc:      SpO2: (!) 89% (!) 89% (!) 89% 90%  Weight:      Height:       Exam  Due to +COVID status direct exam not done, utilizing physical exam of CCM team.     I/O since 3/26 admit, 18L in and 7.8 L out, +10.8 L Wt on admit 90kg >> today 99.5kg    Home meds:  - clonazepam 61m/ topiramate 50 mg   - triamcinolone cream/ benzonatate 200 mg prn  Assessment: 1. AKI on CKD 3 - patient was on pressors for several days, suspect poor perfusion main issue.   No contrast /acei, etc.  Pt is making urine 2. Shock - off of pressors today 3. CKD 3- due to HTN +/- DM, baseline creat 1.5- 1.8  (eGFR 43-53) 4. VDRF  5. Vol excess - up 9-10 kg, diffuseinfiltrates, question some of this may be vol related 6. DM2 7. H/o HTN 8. Hyperkalemia    Plan: 1. IV lasix 80 mg x 1, UA/ UNa/ UCr, will follow      [facility] 10/28/2018, 9:22 PM

## 2018-10-28 NOTE — Progress Notes (Signed)
NAME:  Jack Bowers, MRN:  [id number], DOB:  27-Jul-1951, LOS: 5 ADMISSION DATE:  10/22/2018, CONSULTATION DATE:  3/26 REFERRING MD:  3/26, CHIEF COMPLAINT:  Acute respiratory failure with hypoxemia   Brief History   68 y/o male 3/26 with hypoxemia in setting of severe CAP, positive COVID 19 infection.   Past Medical History  CKD stage III, DM type II, HTN, major depression, migraines, h/o pleural plaque due to asbestos exposure, sleep apnea   Significant Hospital Events   3/23 Presented to [facility], started on azith and rocephin. COVID-19 sent given possible exposure.  3/26 worsening resp failure. Intubated. transferred to [facility] long hospital following intubation. PICC placed RUE.  3/27 no critical events overnight. Remains of ARDS protocol, CXR a little worse. Renal fxn has declined some. Placed on pressors overnight. Transducing CVP  3/30 Remains on high PEEP / FiO2. Triglycerides >500, pt remains on Nimbex / propofol / fentanyl.   Consults:    Procedures:  Intubation 3/26 >> 3/20, 3/30 >>   Significant Diagnostic Tests:  3l23 LDH: 634, CRP 133  Micro Data:  Influenza A/B 3/20: neg Respiratory culture 3/26 >> normal flora  Blood culture 3/26 >> COVID-19 3/23 >> POSTIVE  Antimicrobials:  Azithromycin 3/23 >> Rocephin 3/23 >> 3/29 Plaquenil 3/28 >>  Interim history/subjective:  RN reports pt self extubated overnight.  Reintubated emergently.  Remains on high PEEP / FiO2 support   Objective   Blood pressure (!) 153/65, pulse 88, temperature 97.7 F (36.5 C), temperature source Oral, resp. rate (!) 33, height 5\' 10"  (1.778 m), weight 99.5 kg, SpO2 (!) 89 %.    Vent Mode: PRVC FiO2 (%):  [60 %-100 %] 100 % Set Rate:  [28 bmp-33 bmp] 33 bmp Vt Set:  [430 mL] 430 mL PEEP:  [16 cmH20] 16 cmH20 Plateau Pressure:  [29 cmH20-32 cmH20] 32 cmH20   Intake/Output Summary (Last 24 hours) at 10/28/2018 1040 Last data filed at 10/28/2018 1011 Gross per 24 hour   Intake 2370.31 ml  Output 1825 ml  Net 545.31 ml   Filed Weights   10/26/18 0600 10/27/18 0500 10/28/18 0500  Weight: 98.3 kg 99.8 kg 99.5 kg    Examination: General: critically ill appearing adult male lying in bed on vent HEENT: MM pink/moist, ETT Neuro: sedate / paralyzed  CV: s1s2 rrr, no m/r/g PULM: even/non-labored, lungs bilaterally diminished / coarse  SW:FUXN, non-tender, bsx4 active  Extremities: warm/dry, no edema  Skin: no rashes or lesions  Resolved Hospital Problem list   Septic shock / Hypotension - component of sedation  Assessment & Plan:   ARDS P: ARDS protocol Continue paralytics, versed / fentanyl  Klonopin 1 mg BID  Follow intermittent CXR  COVID 19  P: Continue plaquenil + azithro  Multi-organ failure, poor prognosis Have discussed with family   Hypertension P: PRN hydralazine   AKI  -shock related, Creatinine worse, not oliguric, not acidemic, no other acute indications for HD P: Trend BMP / urinary output Replace electrolytes as indicated Avoid nephrotoxic agents, ensure adequate renal perfusion  DM2 P: SSI  Best practice:  Diet: TF Pain/Anxiety/Delirium protocol (if indicated): yes VAP protocol (if indicated): yes DVT prophylaxis: sub q hep GI prophylaxis: ppi Glucose control: ssi Mobility: bed rest Code Status: full Family Communication: Called son Jack Bowers) for update 3/31.  Reviewed patients current clinical status, progression of illness.  He has multi-system organ failure and is declining.  Doubt he will survive this illness.  Reviewed the concept of no  CPR in the event of arrest. He is going to talk with his mother and call back.  Disposition: ICU  Labs   CBC: Recent Labs  Lab 10/24/18 0537 10/25/18 0429 10/27/18 0455 10/27/18 0614 10/28/18 0536  WBC 6.1 5.4 SPECIMEN CONTAMINATED, UNABLE TO PERFORM TEST(S). 10.6* 15.4*  HGB 12.1* 11.9* SPECIMEN CONTAMINATED, UNABLE TO PERFORM TEST(S). 12.2* 11.8*  HCT 37.4* 37.5*  SPECIMEN CONTAMINATED, UNABLE TO PERFORM TEST(S). 37.2* 37.2*  MCV 98.2 97.2 SPECIMEN CONTAMINATED, UNABLE TO PERFORM TEST(S). 96.6 100.0  PLT 159 206 SPECIMEN CONTAMINATED, UNABLE TO PERFORM TEST(S). 283 286    Basic Metabolic Panel: Recent Labs  Lab 10/24/18 0537 10/25/18 0429  10/26/18 0539 10/27/18 0614 10/27/18 1113 10/27/18 2342 10/28/18 0536  NA 138 140   < > 141 142 142 144 144  K 4.1 3.9   < > 4.0 4.1 4.2 5.6* 5.8*  CL 114* 114*   < > 116* 115* 117* 118* 118*  CO2 15* 17*   < > 15* 15* 15* 16* 16*  GLUCOSE 128* 116*   < > 162* 164* 198* 204* 238*  BUN 21 34*   < > 48* 60* 60* 73* 81*  CREATININE 1.93* 3.93*   < > 4.45* 4.63* 4.38* 5.03* 5.14*  CALCIUM 7.5* 7.6*   < > 7.6* 7.7* 7.3* 7.9* 7.9*  MG 1.7 2.1  --   --   --   --  3.2* 2.9*  PHOS 3.4 4.4  --   --   --   --   --   --    < > = values in this interval not displayed.   GFR: Estimated Creatinine Clearance: 16 mL/min (A) (by C-G formula based on SCr of 5.14 mg/dL (H)). Recent Labs  Lab 10/25/18 0429 10/27/18 0455 10/27/18 0614 10/28/18 0536  WBC 5.4 SPECIMEN CONTAMINATED, UNABLE TO PERFORM TEST(S). 10.6* 15.4*    Liver Function Tests: Recent Labs  Lab 10/27/18 0614  AST 42*  ALT 23  ALKPHOS 84  BILITOT 0.4  PROT 6.1*  ALBUMIN 2.2*   No results for input(s): LIPASE, AMYLASE in the last 168 hours. No results for input(s): AMMONIA in the last 168 hours.  ABG    Component Value Date/Time   PHART 7.218 (L) 10/28/2018 0555   PCO2ART 68.2 (HH) 10/28/2018 0555   PO2ART 58.4 (L) 10/28/2018 0555   HCO3 26.9 10/28/2018 0555   ACIDBASEDEF 1.9 10/28/2018 0555   O2SAT 90.3 10/28/2018 0555     Coagulation Profile: No results for input(s): INR, PROTIME in the last 168 hours.  Cardiac Enzymes: No results for input(s): CKTOTAL, CKMB, CKMBINDEX, TROPONINI in the last 168 hours.  HbA1C: No results found for: HGBA1C  CBG: Recent Labs  Lab 10/27/18 1741 10/27/18 2038 10/27/18 2350 10/28/18 0540  10/28/18 0904  GLUCAP 187* 188* 175* 170* 232*    Critical care time: 40 minutes    Jack Brim, NP-C [place] [facility] Pgr: [phone] or if no answer [phone] 10/28/2018, 10:40 AM

## 2018-10-28 NOTE — Progress Notes (Signed)
eLink Physician-Brief Progress Note Patient Name: DANDY BREVIG DOB: 1951-07-04 MRN: [id number]   Date of Service  10/28/2018  HPI/Events of Note  Hyperglycemia - Blood glucose = 341  eICU Interventions  Will order: 1. Insulin IV infusion per glucose stabilizer protocol.     Intervention Category Major Interventions: Hyperglycemia - active titration of insulin therapy  Lenell Antu 10/28/2018, 8:14 PM

## 2018-10-28 NOTE — Progress Notes (Signed)
eLink Physician-Brief Progress Note Patient Name: Jack Bowers DOB: 06/10/1951 MRN: [id number]   Date of Service  10/28/2018  HPI/Events of Note  Hyperkalemia = K+ = 5.8. QRS not widened or T wave peaked.   eICU Interventions  Will order: 1. Kayexalate 30 gm per tube now.  Rounding team will need to arrange for some form of Renal Replacement Therapy.      Intervention Category Major Interventions: Electrolyte abnormality - evaluation and management  Alynna Hargrove Eugene 10/28/2018, 6:35 AM

## 2018-10-28 NOTE — Progress Notes (Signed)
eLink Physician-Brief Progress Note Patient Name: Jack Bowers DOB: Oct 06, 1950 MRN: [id number]   Date of Service  10/28/2018  HPI/Events of Note  ABG on 100%/PRVC 33/TV 430/P 16 = 7.21/68.2/58.4.  eICU Interventions  Will order: 1. NaHCO3 100 meq IV now.  2. Repeat ABG at 9 AM.     Intervention Category Major Interventions: Acid-Base disturbance - evaluation and management;Respiratory failure - evaluation and management  Lenell Antu 10/28/2018, 6:39 AM

## 2018-10-28 NOTE — Progress Notes (Signed)
eLink Physician-Brief Progress Note Patient Name: Jack Bowers DOB: 26-Jul-1951 MRN: [id number]   Date of Service  10/28/2018  HPI/Events of Note  Hyperkalemia - K+ = 5.6   eICU Interventions  Will order: 1. Kayexalate 30 gm per tube now.  2. Repeat BMP at 5 AM.      Intervention Category Major Interventions: Electrolyte abnormality - evaluation and management  Sommer,Steven Eugene 10/28/2018, 12:54 AM

## 2018-10-28 NOTE — Progress Notes (Signed)
eLink Physician-Brief Progress Note Patient Name: Jack Bowers DOB: 1950-08-13 MRN: [id number]   Date of Service  10/28/2018  HPI/Events of Note  Hypocalcemia - Ca++ = 6.3 which corrects to 7.74 given albumin = 2.2.   eICU Interventions  Will replace Ca++.      Intervention Category Major Interventions: Electrolyte abnormality - evaluation and management  Sommer,Steven Eugene 10/28/2018, 8:06 PM

## 2018-10-29 ENCOUNTER — Encounter (HOSPITAL_COMMUNITY): Payer: Self-pay | Admitting: Nephrology

## 2018-10-29 ENCOUNTER — Inpatient Hospital Stay (HOSPITAL_COMMUNITY): Payer: Medicare Other

## 2018-10-29 LAB — BASIC METABOLIC PANEL
Anion gap: 11 (ref 5–15)
Anion gap: 13 (ref 5–15)
Anion gap: 15 (ref 5–15)
BUN: 97 mg/dL — ABNORMAL HIGH (ref 8–23)
BUN: 99 mg/dL — ABNORMAL HIGH (ref 8–23)
BUN: 102 mg/dL — ABNORMAL HIGH (ref 8–23)
CALCIUM: 6.6 mg/dL — AB (ref 8.9–10.3)
CO2: 28 mmol/L (ref 22–32)
CO2: 28 mmol/L (ref 22–32)
CO2: 31 mmol/L (ref 22–32)
CREATININE: 5.3 mg/dL — AB (ref 0.61–1.24)
Calcium: 6.4 mg/dL — CL (ref 8.9–10.3)
Calcium: 6.3 mg/dL — CL (ref 8.9–10.3)
Chloride: 109 mmol/L (ref 98–111)
Chloride: 105 mmol/L (ref 98–111)
Chloride: 102 mmol/L (ref 98–111)
Creatinine, Ser: 5.51 mg/dL — ABNORMAL HIGH (ref 0.61–1.24)
Creatinine, Ser: 6.33 mg/dL — ABNORMAL HIGH (ref 0.61–1.24)
GFR calc Af Amer: 12 mL/min — ABNORMAL LOW (ref >60)
GFR calc Af Amer: 11 mL/min — ABNORMAL LOW (ref >60)
GFR calc Af Amer: 10 mL/min — ABNORMAL LOW (ref >60)
GFR calc non Af Amer: 10 mL/min — ABNORMAL LOW (ref >60)
GFR calc non Af Amer: 10 mL/min — ABNORMAL LOW (ref >60)
GFR calc non Af Amer: 8 mL/min — ABNORMAL LOW (ref >60)
Glucose, Bld: 179 mg/dL — ABNORMAL HIGH (ref 70–99)
Glucose, Bld: 197 mg/dL — ABNORMAL HIGH (ref 70–99)
Glucose, Bld: 208 mg/dL — ABNORMAL HIGH (ref 70–99)
Potassium: 3.3 mmol/L — ABNORMAL LOW (ref 3.5–5.1)
Potassium: 2.8 mmol/L — ABNORMAL LOW (ref 3.5–5.1)
Potassium: 3.4 mmol/L — ABNORMAL LOW (ref 3.5–5.1)
Sodium: 148 mmol/L — ABNORMAL HIGH (ref 135–145)
Sodium: 146 mmol/L — ABNORMAL HIGH (ref 135–145)
Sodium: 148 mmol/L — ABNORMAL HIGH (ref 135–145)

## 2018-10-29 LAB — GLUCOSE, CAPILLARY
Glucose-Capillary: 139 mg/dL — ABNORMAL HIGH (ref 70–99)
Glucose-Capillary: 144 mg/dL — ABNORMAL HIGH (ref 70–99)
Glucose-Capillary: 144 mg/dL — ABNORMAL HIGH (ref 70–99)
Glucose-Capillary: 148 mg/dL — ABNORMAL HIGH (ref 70–99)
Glucose-Capillary: 150 mg/dL — ABNORMAL HIGH (ref 70–99)
Glucose-Capillary: 154 mg/dL — ABNORMAL HIGH (ref 70–99)
Glucose-Capillary: 162 mg/dL — ABNORMAL HIGH (ref 70–99)
Glucose-Capillary: 163 mg/dL — ABNORMAL HIGH (ref 70–99)
Glucose-Capillary: 163 mg/dL — ABNORMAL HIGH (ref 70–99)
Glucose-Capillary: 166 mg/dL — ABNORMAL HIGH (ref 70–99)
Glucose-Capillary: 172 mg/dL — ABNORMAL HIGH (ref 70–99)
Glucose-Capillary: 194 mg/dL — ABNORMAL HIGH (ref 70–99)
Glucose-Capillary: 205 mg/dL — ABNORMAL HIGH (ref 70–99)
Glucose-Capillary: 235 mg/dL — ABNORMAL HIGH (ref 70–99)

## 2018-10-29 LAB — SODIUM, URINE, RANDOM: SODIUM UR: 43 mmol/L

## 2018-10-29 LAB — BASIC METABOLIC PANEL WITH GFR
Anion gap: 13 (ref 5–15)
BUN: 98 mg/dL — ABNORMAL HIGH (ref 8–23)
CO2: 28 mmol/L (ref 22–32)
Calcium: 6.6 mg/dL — ABNORMAL LOW (ref 8.9–10.3)
Chloride: 106 mmol/L (ref 98–111)
Creatinine, Ser: 5.41 mg/dL — ABNORMAL HIGH (ref 0.61–1.24)
GFR calc Af Amer: 12 mL/min — ABNORMAL LOW
GFR calc non Af Amer: 10 mL/min — ABNORMAL LOW
Glucose, Bld: 257 mg/dL — ABNORMAL HIGH (ref 70–99)
Potassium: 2.9 mmol/L — ABNORMAL LOW (ref 3.5–5.1)
Sodium: 147 mmol/L — ABNORMAL HIGH (ref 135–145)

## 2018-10-29 LAB — CREATININE, URINE, RANDOM: Creatinine, Urine: 112.09 mg/dL

## 2018-10-29 MED ORDER — CLONAZEPAM 1 MG PO TABS
2.0000 mg | ORAL_TABLET | Freq: Two times a day (BID) | ORAL | Status: DC
  Administered 2018-10-29 – 2018-10-31 (×5): 2 mg via ORAL
  Filled 2018-10-29 (×5): qty 2

## 2018-10-29 MED ORDER — INSULIN ASPART 100 UNIT/ML ~~LOC~~ SOLN
0.0000 [IU] | SUBCUTANEOUS | Status: DC
  Administered 2018-10-29: 3 [IU] via SUBCUTANEOUS
  Administered 2018-10-29: 4 [IU] via SUBCUTANEOUS
  Administered 2018-10-29: 3 [IU] via SUBCUTANEOUS
  Administered 2018-10-29: 17:00:00 4 [IU] via SUBCUTANEOUS
  Administered 2018-10-30: 21:00:00 3 [IU] via SUBCUTANEOUS
  Administered 2018-10-30 (×2): 4 [IU] via SUBCUTANEOUS
  Administered 2018-10-30 – 2018-10-31 (×3): 3 [IU] via SUBCUTANEOUS

## 2018-10-29 MED ORDER — POTASSIUM CHLORIDE 20 MEQ/15ML (10%) PO SOLN
40.0000 meq | Freq: Once | ORAL | Status: AC
  Administered 2018-10-29: 40 meq
  Filled 2018-10-29: qty 30

## 2018-10-29 MED ORDER — INSULIN GLARGINE 100 UNIT/ML ~~LOC~~ SOLN
10.0000 [IU] | Freq: Every day | SUBCUTANEOUS | Status: DC
  Administered 2018-10-29 – 2018-10-31 (×3): 10 [IU] via SUBCUTANEOUS
  Filled 2018-10-29 (×3): qty 0.1

## 2018-10-29 MED ORDER — MIDAZOLAM BOLUS VIA INFUSION
2.0000 mg | INTRAVENOUS | Status: DC | PRN
  Filled 2018-10-29: qty 2

## 2018-10-29 MED ORDER — CALCIUM GLUCONATE-NACL 1-0.675 GM/50ML-% IV SOLN
1.0000 g | Freq: Once | INTRAVENOUS | Status: AC
  Administered 2018-10-29: 1000 mg via INTRAVENOUS
  Filled 2018-10-29: qty 50

## 2018-10-29 MED ORDER — LORAZEPAM BOLUS VIA INFUSION: 2.0000 mg | INTRAVENOUS | Status: DC | PRN

## 2018-10-29 NOTE — Progress Notes (Addendum)
NAME:  Jack Bowers, MRN:  [id number], DOB:  11-Nov-1950, LOS: 6 ADMISSION DATE:  10/22/2018, CONSULTATION DATE:  3/26 REFERRING MD:  3/26, CHIEF COMPLAINT:  Acute respiratory failure with hypoxemia   Brief History   68 y/o male 3/26 with hypoxemia in setting of severe CAP, positive COVID 19 infection.   Past Medical History  CKD stage III, DM type II, HTN, major depression, migraines, h/o pleural plaque due to asbestos exposure, sleep apnea   Significant Hospital Events   3/23 Presented to [facility], started on azith and rocephin. COVID-19 sent given possible exposure.  3/26 worsening resp failure. Intubated. transferred to [facility] long hospital following intubation. PICC placed RUE.  3/27 no critical events overnight. Remains of ARDS protocol, CXR a little worse. Renal fxn has declined some. Placed on pressors overnight. Transducing CVP  3/30 Remains on high PEEP / FiO2. Triglycerides >500, pt remains on Nimbex / propofol / fentanyl.   Consults:  Renal 3/31  Procedures:  Intubation 3/26 >> 3/20, 3/30 >>   Significant Diagnostic Tests:  3l23 LDH: 634, CRP 133  Micro Data:  Influenza A/B 3/20: neg Respiratory culture 3/26 >> normal flora  Blood culture 3/26 >> COVID-19 3/23 >> POSTIVE  Antimicrobials:  Azithromycin 3/23 >> Rocephin 3/23 >> 3/29 Plaquenil 3/28 >>  Interim history/subjective:  RN reports episodes of desaturation overnight, hypothermia requiring bair hugger. AF with RVR requiring cardizem.    Objective   Blood pressure (!) 97/54, pulse (!) 127, temperature 98.4 F (36.9 C), temperature source Bladder, resp. rate (!) 33, height 5\' 10"  (1.778 m), weight 100.3 kg, SpO2 93 %. CVP:  [13 mmHg] 13 mmHg  Vent Mode: PRVC FiO2 (%):  [100 %] 100 % Set Rate:  [33 bmp] 33 bmp Vt Set:  [430 mL] 430 mL PEEP:  [16 cmH20] 16 cmH20 Plateau Pressure:  [31 cmH20-32 cmH20] 31 cmH20   Intake/Output Summary (Last 24 hours) at 10/29/2018 0908 Last data filed at  10/29/2018 0800 Gross per 24 hour  Intake 3726.34 ml  Output 3240 ml  Net 486.34 ml   Filed Weights   10/28/18 0500 10/28/18 2151 10/29/18 0500  Weight: 99.5 kg 101.2 kg 100.3 kg    Examination: General: critically ill appearing male on vent HEENT: MM pink/moist, ETT Neuro: sedate / paralyzed  CV: s1s2 rrr, no m/r/g PULM: even/non-labored, lungs bilaterally diminished  BL:TJQZ, non-tender, bsx4 active  Extremities: warm/dry, trace generalized edema  Skin: no rashes or lesions  Resolved Hospital Problem list   Septic shock / Hypotension - component of sedation  Assessment & Plan:   ARDS P: ARDS protocol  Stop paralytics, on >48 hours  Continue versed, fentanyl  Klonopin 2mg  BID  Follow intermittent CXR   COVID 19  P: Continue plaquenil + azithro  Concern for poor prognosis given multi-organ failure  Continue airborne precautions   Hypertension P: PRN hydralazine   AKI  -shock related, Creatinine worse, not oliguric, not acidemic, no other acute indications for HD Hyperkalemia  P: Trend BMP / urinary output Replace electrolytes as indicated Avoid nephrotoxic agents, ensure adequate renal perfusion Appreciate Nephrology input  Continue bicarb gtt at 66ml/hr Caution on replacement of K+   DM2 P: Transition off insulin gtt Add Lantus 10 units  SSI, resistant scale  May need TF coverage  Best practice:  Diet: TF Pain/Anxiety/Delirium protocol (if indicated): yes VAP protocol (if indicated): yes DVT prophylaxis: sub q hep GI prophylaxis: ppi Glucose control: ssi Mobility: bed rest Code Status: LCB, no  CPR Family Communication: Updated per NP 3/31.  MD to update 4/1 Disposition: ICU  Labs   CBC: Recent Labs  Lab 10/24/18 0537 10/25/18 0429 10/27/18 0455 10/27/18 0614 10/28/18 0536  WBC 6.1 5.4 SPECIMEN CONTAMINATED, UNABLE TO PERFORM TEST(S). 10.6* 15.4*  HGB 12.1* 11.9* SPECIMEN CONTAMINATED, UNABLE TO PERFORM TEST(S). 12.2* 11.8*  HCT 37.4*  37.5* SPECIMEN CONTAMINATED, UNABLE TO PERFORM TEST(S). 37.2* 37.2*  MCV 98.2 97.2 SPECIMEN CONTAMINATED, UNABLE TO PERFORM TEST(S). 96.6 100.0  PLT 159 206 SPECIMEN CONTAMINATED, UNABLE TO PERFORM TEST(S). 283 286    Basic Metabolic Panel: Recent Labs  Lab 10/24/18 0537 10/25/18 0429  10/27/18 2342 10/28/18 0536 10/28/18 1800 10/29/18 0015 10/29/18 0415  NA 138 140   < > 144 144 147* 147* 148*  K 4.1 3.9   < > 5.6* 5.8* 3.5 2.9* 3.3*  CL 114* 114*   < > 118* 118* 107 106 109  CO2 15* 17*   < > 16* 16* 27 28 28   GLUCOSE 128* 116*   < > 204* 238* 341* 257* 179*  BUN 21 34*   < > 73* 81* 85* 98* 97*  CREATININE 1.93* 3.93*   < > 5.03* 5.14* 4.81* 5.41* 5.30*  CALCIUM 7.5* 7.6*   < > 7.9* 7.9* 6.3* 6.6* 6.6*  MG 1.7 2.1  --  3.2* 2.9*  --   --   --   PHOS 3.4 4.4  --   --   --   --   --   --    < > = values in this interval not displayed.   GFR: Estimated Creatinine Clearance: 15.6 mL/min (A) (by C-G formula based on SCr of 5.3 mg/dL (H)). Recent Labs  Lab 10/25/18 0429 10/27/18 0455 10/27/18 0614 10/28/18 0536  WBC 5.4 SPECIMEN CONTAMINATED, UNABLE TO PERFORM TEST(S). 10.6* 15.4*    Liver Function Tests: Recent Labs  Lab 10/27/18 0614  AST 42*  ALT 23  ALKPHOS 84  BILITOT 0.4  PROT 6.1*  ALBUMIN 2.2*   No results for input(s): LIPASE, AMYLASE in the last 168 hours. No results for input(s): AMMONIA in the last 168 hours.  ABG    Component Value Date/Time   PHART 7.218 (L) 10/28/2018 0555   PCO2ART 68.2 (HH) 10/28/2018 0555   PO2ART 58.4 (L) 10/28/2018 0555   HCO3 26.9 10/28/2018 0555   ACIDBASEDEF 1.9 10/28/2018 0555   O2SAT 90.3 10/28/2018 0555     Coagulation Profile: No results for input(s): INR, PROTIME in the last 168 hours.  Cardiac Enzymes: No results for input(s): CKTOTAL, CKMB, CKMBINDEX, TROPONINI in the last 168 hours.  HbA1C: No results found for: HGBA1C  CBG: Recent Labs  Lab 10/29/18 0326 10/29/18 0432 10/29/18 0536 10/29/18  0648 10/29/18 0813  GLUCAP 166* 172* 163* 162* 148*    Critical care time: 40 minutes    Canary Brim, NP-C [facility] Pgr: [phone] or if no answer [phone] 10/29/2018, 9:08 AM

## 2018-10-29 NOTE — Progress Notes (Signed)
  0600 Temp of 96.6  per foley temp, bauer hugger placed on patient. Patient turned and cleansed did not tolerate well, patient  Desated to 83, and heart rate increased to 140 and rhythm to Afib RVR, restarted Cardizem at 5 mg.

## 2018-10-29 NOTE — Progress Notes (Signed)
eLink Physician-Brief Progress Note Patient Name: Jack Bowers DOB: [birth year] MRN: [id number]   Date of Service  10/29/2018  HPI/Events of Note  Ca++ = 6.3 and corrects to 7.74 given albumin = 2.2.   eICU Interventions  Will replace Ca++.     Intervention Category Major Interventions: Electrolyte abnormality - evaluation and management  Sommer,Steven Eugene 10/29/2018, 10:54 PM

## 2018-10-29 NOTE — Progress Notes (Signed)
eLink Physician-Brief Progress Note Patient Name: Jack Bowers DOB: [birth year] MRN: [id number]   Date of Service  10/29/2018  HPI/Events of Note  K+ = 2.9 and Creatinine = 5.41.   eICU Interventions  Will order: 1. Cautiously replace K+.     Intervention Category Major Interventions: Electrolyte abnormality - evaluation and management  Jack Bowers Eugene 10/29/2018, 1:39 AM

## 2018-10-29 NOTE — Progress Notes (Signed)
[facility] Progress Note  Subjective: UOP dropping off, BP's softer last 12 hrs, not back on pressors though yet. K down 2.8.  BUN 99 Cr 5.51  Vitals:   10/29/18 1230 10/29/18 1300 10/29/18 1330 10/29/18 1400  BP: (!) 82/41 (!) 100/56 (!) 104/57 (!) 99/56  Pulse: (!) 124 (!) 131 (!) 126 (!) 130  Resp: (!) 33 (!) 33 (!) 33 (!) 33  Temp:      TempSrc:      SpO2: 94% 95% 93% 93%  Weight:      Height:        Inpatient medications: . chlorhexidine gluconate (MEDLINE KIT)  15 mL Mouth Rinse BID  . Chlorhexidine Gluconate Cloth  6 each Topical Daily  . clonazePAM  2 mg Oral BID  . feeding supplement (NEPRO CARB STEADY)  1,000 mL Per Tube Q24H  . feeding supplement (PRO-STAT SUGAR FREE 64)  60 mL Per Tube BID  . heparin  5,000 Units Subcutaneous Q8H  . insulin aspart  0-20 Units Subcutaneous Q4H  . insulin glargine  10 Units Subcutaneous Daily  . mouth rinse  15 mL Mouth Rinse 10 times per day  . pantoprazole sodium  40 mg Per Tube Q24H  . sodium chloride flush  10-40 mL Intracatheter Q12H   . azithromycin Stopped (10/29/18 1144)  . diltiazem (CARDIZEM) infusion 10 mg/hr (10/29/18 0909)  . fentaNYL infusion INTRAVENOUS 125 mcg/hr (10/29/18 1501)  . midazolam 4 mg/hr (10/29/18 1914)  .  sodium bicarbonate (isotonic) infusion in sterile water 75 mL/hr at 10/29/18 1030   acetaminophen (TYLENOL) oral liquid 160 mg/5 mL, fentaNYL, hydrALAZINE, midazolam, sodium chloride flush  Iron/TIBC/Ferritin/ %Sat No results found for: IRON, TIBC, FERRITIN, IRONPCTSAT  Exam:  on vent, not responding, ETT in place  Chest clear ant / lat, no wheezing  Cor reg no RG  Abd slight distended, no ascites, dec'd BS]  Ext 2+ diffuse nonpitting edema LE's/ UE's  Neuro not responsive   Assessment: 1. AKI on CKD 3- baseline creat 1.6- 1.8.  No nephrotoxins.  Suspect ATN due to shock /pressors.  Urine output down last 24 hrs. Poor prognosis. Have d/w primary team, agree that poor prognosis  from MSOF is not likely to be changed by RRT. See primary notes.  2. Resp failure/ ARDS/ COVID-19+ infection - not oxygenating well 3. H/o DM2 not on any medications 4. HTN 5. H/o depression 6. Hyperkalemia - resolved    P: Will sign off   [facility] 10/29/2018, 3:42 PM  Recent Labs  Lab 10/24/18 0537 10/25/18 0429  10/27/18 0614  10/29/18 0415 10/29/18 0844  NA 138 140   < > 142   < > 148* 146*  K 4.1 3.9   < > 4.1   < > 3.3* 2.8*  CL 114* 114*   < > 115*   < > 109 105  CO2 15* 17*   < > 15*   < > 28 28  GLUCOSE 128* 116*   < > 164*   < > 179* 197*  BUN 21 34*   < > 60*   < > 97* 99*  CREATININE 1.93* 3.93*   < > 4.63*   < > 5.30* 5.51*  CALCIUM 7.5* 7.6*   < > 7.7*   < > 6.6* 6.4*  PHOS 3.4 4.4  --   --   --   --   --   ALBUMIN  --   --   --  2.2*  --   --   --    < > = values in this interval not displayed.   Recent Labs  Lab 10/27/18 0614  AST 42*  ALT 23  ALKPHOS 84  BILITOT 0.4  PROT 6.1*   Recent Labs  Lab 10/27/18 0614 10/28/18 0536  WBC 10.6* 15.4*  HGB 12.2* 11.8*  HCT 37.2* 37.2*  MCV 96.6 100.0  PLT 283 286

## 2018-10-29 DEATH — deceased

## 2018-10-30 ENCOUNTER — Inpatient Hospital Stay (HOSPITAL_COMMUNITY): Payer: Medicare Other

## 2018-10-30 LAB — BLOOD GAS, ARTERIAL
Acid-Base Excess: 0.6 mmol/L (ref 0.0–2.0)
Acid-base deficit: 2 mmol/L (ref 0.0–2.0)
Bicarbonate: 26.4 mmol/L (ref 20.0–28.0)
Bicarbonate: 27.8 mmol/L (ref 20.0–28.0)
Drawn by: 23532
Drawn by: 295031
FIO2: 100
FIO2: 100
MECHVT: 0.43 mL
MECHVT: 430 mL
O2 Saturation: 91.3 %
O2 Saturation: 95.9 %
PEEP: 20 cmH2O
PEEP: 20 cmH2O
Patient temperature: 98.8
Patient temperature: 98.6
RATE: 33 resp/min
RATE: 33 resp/min
pCO2 arterial: 68.8 mmHg (ref 32.0–48.0)
pCO2 arterial: 62.5 mmHg — ABNORMAL HIGH (ref 32.0–48.0)
pH, Arterial: 7.209 — ABNORMAL LOW (ref 7.350–7.450)
pH, Arterial: 7.271 — ABNORMAL LOW (ref 7.350–7.450)
pO2, Arterial: 72.9 mmHg — ABNORMAL LOW (ref 83.0–108.0)
pO2, Arterial: 89.9 mmHg (ref 83.0–108.0)

## 2018-10-30 LAB — BASIC METABOLIC PANEL
Anion gap: 14 (ref 5–15)
Anion gap: 14 (ref 5–15)
BUN: 132 mg/dL — ABNORMAL HIGH (ref 8–23)
BUN: 134 mg/dL — ABNORMAL HIGH (ref 8–23)
CO2: 36 mmol/L — ABNORMAL HIGH (ref 22–32)
CO2: 35 mmol/L — ABNORMAL HIGH (ref 22–32)
Calcium: 5.9 mg/dL — CL (ref 8.9–10.3)
Calcium: 6.2 mg/dL — CL (ref 8.9–10.3)
Chloride: 98 mmol/L (ref 98–111)
Chloride: 100 mmol/L (ref 98–111)
Creatinine, Ser: 6.45 mg/dL — ABNORMAL HIGH (ref 0.61–1.24)
Creatinine, Ser: 6.56 mg/dL — ABNORMAL HIGH (ref 0.61–1.24)
GFR calc Af Amer: 9 mL/min — ABNORMAL LOW (ref >60)
GFR calc Af Amer: 9 mL/min — ABNORMAL LOW (ref >60)
GFR calc non Af Amer: 8 mL/min — ABNORMAL LOW (ref >60)
GFR calc non Af Amer: 8 mL/min — ABNORMAL LOW (ref >60)
Glucose, Bld: 197 mg/dL — ABNORMAL HIGH (ref 70–99)
Glucose, Bld: 180 mg/dL — ABNORMAL HIGH (ref 70–99)
Potassium: 3.6 mmol/L (ref 3.5–5.1)
Potassium: 3.4 mmol/L — ABNORMAL LOW (ref 3.5–5.1)
Sodium: 148 mmol/L — ABNORMAL HIGH (ref 135–145)
Sodium: 149 mmol/L — ABNORMAL HIGH (ref 135–145)

## 2018-10-30 LAB — GLUCOSE, CAPILLARY
Glucose-Capillary: 168 mg/dL — ABNORMAL HIGH (ref 70–99)
Glucose-Capillary: 147 mg/dL — ABNORMAL HIGH (ref 70–99)
Glucose-Capillary: 167 mg/dL — ABNORMAL HIGH (ref 70–99)
Glucose-Capillary: 137 mg/dL — ABNORMAL HIGH (ref 70–99)

## 2018-10-30 LAB — CBC
HCT: 26.9 % — ABNORMAL LOW (ref 39.0–52.0)
Hemoglobin: 8.9 g/dL — ABNORMAL LOW (ref 13.0–17.0)
MCH: 31.3 pg (ref 26.0–34.0)
MCHC: 33.1 g/dL (ref 30.0–36.0)
MCV: 94.7 fL (ref 80.0–100.0)
Platelets: 138 10*3/uL — ABNORMAL LOW (ref 150–400)
RBC: 2.84 MIL/uL — ABNORMAL LOW (ref 4.22–5.81)
RDW: 16.5 % — ABNORMAL HIGH (ref 11.5–15.5)
WBC: 11.2 10*3/uL — ABNORMAL HIGH (ref 4.0–10.5)
nRBC: 1 % — ABNORMAL HIGH (ref 0.0–0.2)

## 2018-10-30 LAB — MAGNESIUM: Magnesium: 2.6 mg/dL — ABNORMAL HIGH (ref 1.7–2.4)

## 2018-10-30 MED ORDER — SODIUM BICARBONATE 8.4 % IV SOLN
100.0000 meq | Freq: Once | INTRAVENOUS | Status: AC
  Administered 2018-10-30: 100 meq via INTRAVENOUS
  Filled 2018-10-30: qty 50

## 2018-10-30 MED ORDER — CALCIUM GLUCONATE-NACL 2-0.675 GM/100ML-% IV SOLN
2.0000 g | Freq: Once | INTRAVENOUS | Status: AC
  Administered 2018-10-30: 2000 mg via INTRAVENOUS
  Filled 2018-10-30: qty 100

## 2018-10-30 MED ORDER — CALCIUM GLUCONATE-NACL 2-0.675 GM/100ML-% IV SOLN
2.0000 g | Freq: Once | INTRAVENOUS | Status: AC
  Administered 2018-10-30: 18:00:00 2000 mg via INTRAVENOUS
  Filled 2018-10-30: qty 100

## 2018-10-30 NOTE — Progress Notes (Signed)
NAME:  Jack Bowers, MRN:  [id number], DOB:  Dec 09, 1950, LOS: 7 ADMISSION DATE:  November 07, 2018, CONSULTATION DATE:  3/26 REFERRING MD:  3/26, CHIEF COMPLAINT:  Acute respiratory failure with hypoxemia   Brief History   68 y/o male 3/26 with hypoxemia in setting of severe CAP, positive COVID 19 infection.  Sick contacts at [employer] [place])  Past Medical History  CKD stage III, DM type II, HTN, major depression, migraines, h/o pleural plaque due to asbestos exposure, sleep apnea.  Son reports prior asbestos exposures at work.  Significant Hospital Events   3/23 Presented to [facility], started on azith and rocephin. COVID-19 sent given possible exposure.  3/26 worsening resp failure. Intubated. transferred to [facility] long hospital following intubation. PICC placed RUE.  3/27 no critical events overnight. Remains of ARDS protocol, CXR a little worse. Renal fxn has declined some. Placed on pressors overnight. Transducing CVP  3/30 Remains on high PEEP / FiO2. Triglycerides >500, pt remains on Nimbex / propofol / fentanyl.  4/01 Episodes of desaturation overnight, hypothermia requiring bair hugger. AFwRVR > cardizem.    Consults:  Renal 3/31  Procedures:  Intubation 3/26 >> 3/30, 3/30 >>   Significant Diagnostic Tests:  3/23 LDH: 634, CRP 133  Micro Data:  Influenza A/B 3/20: neg Respiratory culture 3/26 >> normal flora  Blood culture 3/26 >> COVID-19 3/23 >> POSTIVE  Antimicrobials:  Azithromycin 3/27 >> 4/2 Rocephin 3/23 >> 3/29 Plaquenil 3/28 >>  Interim history/subjective:  Bicarb infusion increased overnight for acidosis, Ca+ replaced.  Decreased UOP (180 ml). PEEP increased to 20.    Objective   Blood pressure (!) 90/52, pulse (!) 116, temperature 98.6 F (37 C), temperature source Bladder, resp. rate (!) 33, height  (1.778 m), weight 105.1 kg, SpO2 95 %. CVP:  [19 mmHg-21 mmHg] 20 mmHg  Vent Mode: PRVC FiO2 (%):  [100 %] 100 % Set  Rate:  [33 bmp] 33 bmp Vt Set:  [430 mL] 430 mL PEEP:  [16 cmH20-20 cmH20] 20 cmH20 Plateau Pressure:  [29 cmH20-37 cmH20] 37 cmH20   Intake/Output Summary (Last 24 hours) at 10/30/2018 0809 Last data filed at 10/30/2018 0865 Gross per 24 hour  Intake 3474 ml  Output 765 ml  Net 2709 ml   Filed Weights   10/28/18 2151 10/29/18 0500 10/30/18 0457  Weight: 101.2 kg 100.3 kg 105.1 kg    Examination: General: critically ill appearing male lying in bed on vent  HEENT: MM pink/moist, ETT Neuro: sedate CV: s1s2 irr irr, AF on monitor, no m/r/g PULM: even/non-labored, lungs bilaterally coarse, diminished bases  HQ:IONG, non-tender, bsx4 active  Extremities: warm/dry, 1-2+ generalized edema  Skin: no rashes or lesions  Resolved Hospital Problem list   Septic shock / Hypotension - component of sedation  Assessment & Plan:   ARDS P: ARDS, low Vt ventilation  PEEP 20 Wean PEEP / FiO2 as able per protocol Continue fentanyl, versed gtts for RASS -3/-4  Klonopin  BID  Follow CXR intermittently   COVID 19  P: Plaquenil D6/x  Azithro stopped 4/2 Airborne precautions  Worsening acidosis, hypercarbia > concerned he will not survive given multi-organ dysfunction  Hypertension P: Hydralazine as needed   AF  P: Continue cardizem gtt   AKI  -shock related, Creatinine worse, not oliguric, not acidemic, no other acute indications for HD Hyperkalemia  P: Appreciate Nephrology > not a candidate for HD  Continue Bicarb gtt  Trend BMP / urinary output Replace electrolytes as  indicated Avoid nephrotoxic agents, ensure adequate renal perfusion  DM2 P: Lantus 10 units QD  SSI with resistant scale   Best practice:  Diet: TF Pain/Anxiety/Delirium protocol (if indicated): yes VAP protocol (if indicated): yes DVT prophylaxis: sub q hep GI prophylaxis: PPI Glucose control: SSI Mobility: bed rest Code Status: LCB, no CPR Family Communication: Son called 4/2 for update.   Indicates he is going to talk with his mother regarding patients status.  He states they want [facility] in [place].   Disposition: ICU  Labs   CBC: Recent Labs  Lab 10/25/18 0429 10/27/18 0455 10/27/18 0614 10/28/18 0536 10/30/18 0500  WBC 5.4 SPECIMEN CONTAMINATED, UNABLE TO PERFORM TEST(S). 10.6* 15.4* 11.2*  HGB 11.9* SPECIMEN CONTAMINATED, UNABLE TO PERFORM TEST(S). 12.2* 11.8* 8.9*  HCT 37.5* SPECIMEN CONTAMINATED, UNABLE TO PERFORM TEST(S). 37.2* 37.2* 26.9*  MCV 97.2 SPECIMEN CONTAMINATED, UNABLE TO PERFORM TEST(S). 96.6 100.0 94.7  PLT 206 SPECIMEN CONTAMINATED, UNABLE TO PERFORM TEST(S). 283 286 138*    Basic Metabolic Panel: Recent Labs  Lab 10/24/18 0537 10/25/18 0429  10/27/18 2342 10/28/18 0536  10/29/18 0015 10/29/18 0415 10/29/18 0844 10/29/18 2000 10/30/18 0500  NA 138 140   < > 144 144   < > 147* 148* 146* 148* 148*  K 4.1 3.9   < > 5.6* 5.8*   < > 2.9* 3.3* 2.8* 3.4* 3.6  CL 114* 114*   < > 118* 118*   < > 106 109 105 102 98  CO2 15* 17*   < > 16* 16*   < > 28 28 28 31  36*  GLUCOSE 128* 116*   < > 204* 238*   < > 257* 179* 197* 208* 197*  BUN 21 34*   < > 73* 81*   < > 98* 97* 99* 102* 132*  CREATININE 1.93* 3.93*   < > 5.03* 5.14*   < > 5.41* 5.30* 5.51* 6.33* 6.45*  CALCIUM 7.5* 7.6*   < > 7.9* 7.9*   < > 6.6* 6.6* 6.4* 6.3* 5.9*  MG 1.7 2.1  --  3.2* 2.9*  --   --   --   --   --  2.6*  PHOS 3.4 4.4  --   --   --   --   --   --   --   --   --    < > = values in this interval not displayed.   GFR: Estimated Creatinine Clearance: 13.1 mL/min (A) (by C-G formula based on SCr of 6.45 mg/dL (H)). Recent Labs  Lab 10/27/18 0455 10/27/18 0614 10/28/18 0536 10/30/18 0500  WBC SPECIMEN CONTAMINATED, UNABLE TO PERFORM TEST(S). 10.6* 15.4* 11.2*    Liver Function Tests: Recent Labs  Lab 10/27/18 0614  AST 42*  ALT 23  ALKPHOS 84  BILITOT 0.4  PROT 6.1*  ALBUMIN 2.2*   No results for input(s): LIPASE, AMYLASE in the last 168 hours.  No results for input(s): AMMONIA in the last 168 hours.  ABG    Component Value Date/Time   PHART 7.271 (L) 10/30/2018 0731   PCO2ART 62.5 (H) 10/30/2018 0731   PO2ART 89.9 10/30/2018 0731   HCO3 27.8 10/30/2018 0731   ACIDBASEDEF 2.0 10/30/2018 0302   O2SAT 95.9 10/30/2018 0731     Coagulation Profile: No results for input(s): INR, PROTIME in the last 168 hours.  Cardiac Enzymes: No results for input(s): CKTOTAL, CKMB, CKMBINDEX, TROPONINI in the last 168 hours.  HbA1C: No results found for:  HGBA1C  CBG: Recent Labs  Lab 10/29/18 1334 10/29/18 1705 10/29/18 2050 10/29/18 2330 10/30/18 0357  GLUCAP 144* 154* 150* 163* 168*    Critical care time: 40 minutes    Canary Brim, NP-C [facility] Pgr: [phone] or if no answer [phone] 10/30/2018, 8:09 AM

## 2018-10-30 NOTE — Progress Notes (Signed)
Assisted  video teleconference with patient's wife and son.

## 2018-10-30 NOTE — Progress Notes (Addendum)
CRITICAL VALUE ALERT  Critical Value:  Calcium 5.9  Date & Time Notied:  10/30/18 0600  Provider Notified: Pola Corn   Orders Received/Actions taken: New orders

## 2018-10-30 NOTE — Progress Notes (Signed)
eLink Physician-Brief Progress Note Patient Name: Jack Bowers DOB: [birth year] MRN: [id number]   Date of Service  10/30/2018  HPI/Events of Note  ABG on 100%/PRVC 33/TV 430/P 20 = 7.21/68.4/72/26.4  eICU Interventions  Will order: 1. NaHCO3 100 meq IV now.  2. Increase NaHCO3 IV infusion to 100 mL/hour.  3. Repeat ABG at 7.15 AM.     Intervention Category Major Interventions: Acid-Base disturbance - evaluation and management;Respiratory failure - evaluation and management  Sommer,Steven Dennard Nip 10/30/2018, 3:27 AM

## 2018-10-30 NOTE — Progress Notes (Signed)
eLink Physician-Brief Progress Note Patient Name: Jack Bowers DOB: February 14, 1951 MRN: [id number]   Date of Service  10/30/2018  HPI/Events of Note  Calcium = 5.9.  eICU Interventions  Will replace Ca++.     Intervention Category Major Interventions: Electrolyte abnormality - evaluation and management  Akane Tessier Eugene 10/30/2018, 6:13 AM

## 2018-10-31 DIAGNOSIS — J80 Acute respiratory distress syndrome: Secondary | ICD-10-CM

## 2018-10-31 DIAGNOSIS — A419 Sepsis, unspecified organism: Secondary | ICD-10-CM

## 2018-10-31 DIAGNOSIS — N179 Acute kidney failure, unspecified: Secondary | ICD-10-CM

## 2018-10-31 DIAGNOSIS — R6521 Severe sepsis with septic shock: Secondary | ICD-10-CM

## 2018-10-31 LAB — BLOOD GAS, ARTERIAL
Acid-Base Excess: 2 mmol/L (ref 0.0–2.0)
Acid-Base Excess: 2.6 mmol/L — ABNORMAL HIGH (ref 0.0–2.0)
Bicarbonate: 30.4 mmol/L — ABNORMAL HIGH (ref 20.0–28.0)
Bicarbonate: 29.8 mmol/L — ABNORMAL HIGH (ref 20.0–28.0)
Drawn by: 308601
FIO2: 100
FIO2: 100
MECHVT: 430 mL
MECHVT: 480 mL
O2 Saturation: 82.6 %
O2 Saturation: 82.2 %
PEEP: 20 cmH2O
PEEP: 20 cmH2O
Patient temperature: 38.7
Patient temperature: 37.8
RATE: 33 resp/min
RATE: 33 resp/min
pCO2 arterial: 79.2 mmHg (ref 32.0–48.0)
pCO2 arterial: 66.9 mmHg (ref 32.0–48.0)
pH, Arterial: 7.22 — ABNORMAL LOW (ref 7.350–7.450)
pH, Arterial: 7.276 — ABNORMAL LOW (ref 7.350–7.450)
pO2, Arterial: 62.6 mmHg — ABNORMAL LOW (ref 83.0–108.0)
pO2, Arterial: 55.7 mmHg — ABNORMAL LOW (ref 83.0–108.0)

## 2018-10-31 LAB — GLUCOSE, CAPILLARY
Glucose-Capillary: 119 mg/dL — ABNORMAL HIGH (ref 70–99)
Glucose-Capillary: 131 mg/dL — ABNORMAL HIGH (ref 70–99)
Glucose-Capillary: 150 mg/dL — ABNORMAL HIGH (ref 70–99)

## 2018-11-10 ENCOUNTER — Telehealth: Payer: Self-pay | Admitting: Pulmonary Disease

## 2018-11-10 NOTE — Telephone Encounter (Incomplete)
11/10/18 received d/c from [employer] [facility] for Dr.Ellison she will be off till Thursday 11/13/18. Will see if Dr.Mannam will sign for her. PWR

## 2018-11-13 ENCOUNTER — Telehealth: Payer: Self-pay

## 2018-11-13 NOTE — Telephone Encounter (Signed)
Received sign dc back from Doctor Byrum. I called funeral home to let them know dc was mailed to vital records per the funeral home request.

## 2018-11-28 NOTE — Progress Notes (Signed)
Nutrition Follow-up  RD working remotely.   DOCUMENTATION CODES:   Not applicable  INTERVENTION:  - No nutrition interventions at this time.    NUTRITION DIAGNOSIS:   Inadequate oral intake related to inability to eat as evidenced by NPO status. -ongoing  GOAL:   Patient will meet greater than or equal to 90% of their needs -met with TF regimen  MONITOR:   Vent status, TF tolerance, Weight trends, Labs, Skin  ASSESSMENT:   68 year old male with hx of CKD stage 3, Type 2 DM, HTN, and depression. Patient was admitted 3/26 with acute hypoxic respiratory failure in the setting of community-acquired pneumonia and presumed COVID-19 infection. Intubated prior to transfer to [facility].  Weight continues to trend up. Continue to use weight from 3/20 at [facility] (92.1 kg) to re-estimate kcal need. RN flow sheet indicates mild edema to all extremities. Patient remains intubated with OGT in place. Order in place for Nepro @ 35 ml/hr with 60 ml prostat BID. This regimen provides 1912 kcal, 128 grams protein, and 611 ml free water.   Read Jack Bowers's note from this AM. Plan for transition to comfort care with family to be able to camera in to room. Patient anticipated to pass in minutes to hours.   Patient is currently intubated on ventilator support MV: 14.1 L/min (as of 9:07 AM) Temp (24hrs), Avg:99.5 F (37.5 C), Min:96.6 F (35.9 C), Max:100.4 F (38 C) Propofol: none  Medications reviewed; sliding scale novolog, 10 units lantus/day. Labs reviewed from 4/2; CBGs: 150, 119, and 131 mg/dl today, Na: 149 mmol/l, K: 3.4 mmol/l, BUN: 134 mg/dl, creatinine: 6.56 mg/dl, Ca: 6.2 mg/dl, Mg: 2.6 mg/dl, GFR: 8 ml/min.  IVF; 150 mEq sodium bicarb in sterile water @ 100 ml/hr. Drips as of 8 AM; fentanyl @ 100 mcg/hr.    Diet Order:   Diet Order            Diet NPO time specified  Diet effective now              EDUCATION NEEDS:   No education needs have been identified at this  time  Skin:  Skin Assessment: Skin Integrity Issues: Skin Integrity Issues:: Stage II Stage II: coccyx (newly documented on 3/30)  Last BM:  4/3  Height:   Ht Readings from Last 1 Encounters:  10/21/2018 _0  (1.778 m)    Weight:   Wt Readings from Last 1 Encounters:  11-06-18 109.5 kg    Ideal Body Weight:  75.45 kg  BMI:  Body mass index is 34.64 kg/m.  Estimated Nutritional Needs:   Kcal:  2200 kcal  Protein:  115-140 grams  Fluid:  >/= 2.2 L/day     Jack Matin, MS, RD, LDN, [name] Inpatient Clinical Dietitian Pager # [phone] After hours/weekend pager # [phone]

## 2018-11-28 NOTE — Progress Notes (Signed)
Assisted in teleconference between patient and close family friend. Permission confirmed through bedside RN, who had spoken with patient's son.

## 2018-11-28 NOTE — Progress Notes (Signed)
Assisted in teleconference with patient and patient's family

## 2018-11-28 NOTE — Progress Notes (Signed)
Pts son Taiveon is requesting Meryl Crutch to be able to chat via elink. Will notify elink.

## 2018-11-28 NOTE — Progress Notes (Signed)
Chaplain present outside room during vent end and pt death.  Shared prayers in unit at family request.  Will follow with this family for continued assessment, support, and resources as needed.  Please contact spiritual care for support needs that arise in unit or from family contact.    Thank you for your support of this family  Burnis Kingfisher, MDiv, [facility]

## 2018-11-28 NOTE — Progress Notes (Signed)
Upon arrival pt sating low 80's. FIO2 100%. MD aware. MD assessing pt. Will continue to monitor.

## 2018-11-28 NOTE — Progress Notes (Signed)
eLink Physician-Brief Progress Note Patient Name: Jack Bowers DOB: Jul 05, 1951 MRN: [id number]   Date of Service  11/21/2018  HPI/Events of Note  Notified of ABG 7.22/79/62/30 on PRVC 100%, PEEP 20, RR 30, TV 430.  eICU Interventions  Increase tidal volume to 480.  Recheck ABG in 1 hour.      Intervention Category Minor Interventions: Other:  Larinda Buttery 11-21-2018, 4:15 AM

## 2018-11-28 NOTE — Progress Notes (Signed)
Assisted teleconference with patient, patient's wife and son.

## 2018-11-28 NOTE — Procedures (Signed)
Extubation Procedure Note  Patient Details:   Name: Jack Bowers DOB: [birth year] MRN: [id number]   Airway Documentation:    Vent end date: 2018/11/16 Vent end time: 1155   Evaluation  O2 sats:  Complications: none Patient tolerated procedure well. Bilateral Breath Sounds: Diminished   Terminal wean-pt sedated.  Revonda Humphrey 2018/11/16, 12:04 PM

## 2018-11-28 NOTE — Death Summary Note (Addendum)
DEATH SUMMARY   Patient Details  Name: LADARRION EADY MRN: [id number] DOB: 12-18-1950  Admission/Discharge Information   Admit Date:  November 08, 2018  Date of Death: Date of Death: 11-16-18  Time of Death: Time of Death: 1155/11/30  Length of Stay: 8  Referring Physician: Olive Bass, MD   Reason(s) for Hospitalization  COVID-19 pnemonia  Diagnoses  Preliminary cause of death: ARDS secondary to COVID-19 Secondary Diagnoses (including complications and co-morbidities):  Principal Problem:   Acute respiratory distress syndrome (ARDS) due to COVID-19 virus Active Problems:   Pressure injury of skin   Septic shock (HCC)   AKI (acute kidney injury) [facility])   Brief Hospital Course (including significant findings, care, treatment, and services provided and events leading to death)  ZEVEN STEWARDSON is a 68 y.o. year old male with diabetes, CKD stage III and hypertension who was transferred from [facility] for ARDS secondary to community-acquired pneumonia related to COVID-19.  Hospital course includes respiratory failure despite maximal vent settings, septic shock and AKI.  Due to patient's multiorgan dysfunction secondary to viral illness, patient's prognosis was poor.  Family discussions regarding care were held daily.  Ultimately, patient had significant desaturations despite maximal vent settings.  After discussion with family about his deteriorating clinical status, decision was made to withdraw care.  Patient expired at 1157 on 16-Nov-2018.   Pertinent Labs and Studies  Significant Diagnostic Studies Dg Abd 1 View  Result Date: 10/27/2018 CLINICAL DATA:  Enteric tube placement. EXAM: ABDOMEN - 1 VIEW COMPARISON:  Chest x-ray today. FINDINGS: Enteric tube courses into the stomach as tip is over the distal stomach in the midline upper abdomen. Bowel gas pattern is nonobstructive. Known bilateral airspace process of the lungs with left-sided effusion. IMPRESSION: Nonobstructive bowel gas pattern.  Enteric tube over the stomach with tip in the midline upper abdomen likely in the distal stomach. Electronically Signed   By: Elberta Fortis M.D.   On: 10/27/2018 19:42   US Renal  Result Date: 10/29/2018 CLINICAL DATA:  Acute kidney injury. Chronic kidney disease. COVID positive. EXAM: RENAL / URINARY TRACT ULTRASOUND COMPLETE COMPARISON:  None. FINDINGS: Right Kidney: Renal measurements: 11.5 X 4.9 X 5.4 CM = volume: 160 mL. The right kidney is diffusely echogenic. It is hyperechoic compared to the index organ, the liver. A simple cyst at the upper pole measures 2.1 x 1.8 x 1.7 cm. No solid mass lesion or stone is present. There is no hydronephrosis. Left Kidney: Renal measurements: 13.1 x 5.7 x 4.9 cm = volume: 191.7 mL. The left kidney is diffusely echogenic. It is hyperechoic to the index organ, the spleen. No focal lesions are present. There is no stone or hydronephrosis. Bladder: A Foley catheter is in place. Bilateral pleural effusions are noted. IMPRESSION: 1. Kidneys are hyperechoic bilaterally. This is nonspecific, but commonly seen in the setting of medical renal disease. 2. Simple cyst at the upper pole of the right kidney. 3. No stone, mass lesion, or hydronephrosis. 4. Bilateral pleural effusions. Electronically Signed   By: Marin Roberts M.D.   On: 10/29/2018 10:08   Dg Chest Port 1 View  Result Date: 10/30/2018 CLINICAL DATA:  Headache, cough EXAM: PORTABLE CHEST 1 VIEW COMPARISON:  10/27/2018 FINDINGS: Support devices are stable. Patchy bilateral airspace disease again noted, not significantly changed. Heart is borderline in size. No visible significant effusions. IMPRESSION: Continued extensive patchy bilateral airspace disease, not significantly changed. Electronically Signed   By: Charlett Nose M.D.   On: 10/30/2018 07:17  Dg Chest Port 1 View  Result Date: 10/27/2018 CLINICAL DATA:  Endotracheal tube placement. EXAM: PORTABLE CHEST 1 VIEW COMPARISON:  Chest radiograph October 27, 2018 at 0354 hours. FINDINGS: Endotracheal tube tip projects 4.3 cm above the carina. Nasogastric tube past proximal stomach, distal tip out of field of view. Multiple EKG lines overlie the patient and may obscure subtle underlying pathology. Worsening interstitial and alveolar airspace opacities. Trace pleural effusions. No pneumothorax. Cardiomediastinal silhouette is normal. Soft tissue planes included osseous structures are non suspicious. IMPRESSION: 1. Similar appearance of life support lines. 2. Worsening interstitial and alveolar airspace opacities. Trace pleural effusions. Electronically Signed   By: Awilda Metro M.D.   On: 10/27/2018 19:37   Dg Chest Port 1 View  Result Date: 10/27/2018 CLINICAL DATA:  Acute respiratory failure with hypoxia. Community acquired pneumonia, COVID. EXAM: PORTABLE CHEST 1 VIEW COMPARISON:  Two days ago FINDINGS: Endotracheal tube tip between the clavicular heads and carina. The orogastric tube tip and side-port reaches the stomach. Right PICC with tip at the SVC. Airspace disease that has become more generalized. Normal heart size. No effusion or pneumothorax. IMPRESSION: 1. Stable hardware positioning. 2. Progressive generalized airspace disease. Electronically Signed   By: Marnee Spring M.D.   On: 10/27/2018 06:41   Dg Chest Port 1 View  Result Date: 10/25/2018 CLINICAL DATA:  Respiratory failure EXAM: PORTABLE CHEST 1 VIEW COMPARISON:  10/24/2018 FINDINGS: Endotracheal tube terminates 4 cm above the carina. Multifocal patchy opacities in the bilateral upper lobes and right lower lobe. When compared to the prior, the right lower lobe opacity is progressive. No definite pleural effusions. No pneumothorax. The heart is normal in size. Right arm PICC terminates cavoatrial junction. Enteric tube courses into the stomach. IMPRESSION: Endotracheal tube terminates 4 cm above the carina. Additional support apparatus as above. Multifocal pneumonia, as above, progressive  in the right lower lobe. Electronically Signed   By: Charline Bills M.D.   On: 10/25/2018 05:32   Dg Chest Port 1 View  Result Date: 10/24/2018 CLINICAL DATA:  ARDS.  Intubation. EXAM: PORTABLE CHEST 1 VIEW COMPARISON:  003/09/202010 FINDINGS: Endotracheal tube in good position. NG tube enters the stomach. Interval placement of right-sided PICC with the tip at the cavoatrial junction. Extensive bilateral airspace disease right greater than left is unchanged. Heart size normal. No pleural effusion. No pneumothorax. IMPRESSION: Right arm PICC tip at the cavoatrial junction. Endotracheal tube remains in good position. Severe diffuse bilateral airspace disease unchanged. Electronically Signed   By: Marlan Palau M.D.   On: 10/24/2018 07:12   Dg Chest Port 1 View  Result Date: 10/09/2018 CLINICAL DATA:  Acute hypoxic respiratory failure. EXAM: PORTABLE CHEST 1 VIEW COMPARISON:  10/08/2018 at 9:25 a.m. FINDINGS: Allowing for differences in patient positioning and technique, there has been no significant change in the bilateral areas of airspace lung consolidation. No new lung abnormalities. No convincing pleural effusion and no pneumothorax. Endotracheal tube tip projects 3.1 cm above the Carina. Nasal/orogastric tube passes below the diaphragm into the mid stomach. IMPRESSION: 1. No significant change from the exam performed earlier today. 2. Persistent bilateral areas of lung space opacity consistent with multifocal pneumonia. 3. Stable, well-positioned support apparatus. Electronically Signed   By: Amie Portland M.D.   On: 09/30/2018 16:45   [place] Ekg Site Rite  Result Date: 10/15/2018 If Site Rite image not attached, placement could not be confirmed due to current cardiac rhythm.   Microbiology Recent Results (from the past 240 hour(s))  Culture, respiratory (tracheal aspirate)     Status: None   Collection Time: 10/18/2018  3:34 PM  Result Value Ref Range Status   Specimen Description   Final     TRACHEAL ASPIRATE Performed at [facility], [address] [place], [place] [address]    Special Requests   Final    NONE Performed at [facility], [address] [place], [place] [address]    Gram Stain   Final    ABUNDANT WBC PRESENT,BOTH PMN AND MONONUCLEAR NO ORGANISMS SEEN    Culture   Final    Consistent with normal respiratory flora. Performed at [facility], [address] [place], [place] [address]    Report Status 10/27/2018 FINAL  Final    Lab Basic Metabolic Panel: Recent Labs  Lab 10/25/18 0429  10/27/18 2342 10/28/18 0536  10/29/18 0415 10/29/18 0844 10/29/18 2000 10/30/18 0500 10/30/18 1546  NA 140   < > 144 144   < > 148* 146* 148* 148* 149*  K 3.9   < > 5.6* 5.8*   < > 3.3* 2.8* 3.4* 3.6 3.4*  CL 114*   < > 118* 118*   < > 109 105 102 98 100  CO2 17*   < > 16* 16*   < > 28 28 31  36* 35*  GLUCOSE 116*   < > 204* 238*   < > 179* 197* 208* 197* 180*  BUN 34*   < > 73* 81*   < > 97* 99* 102* 132* 134*  CREATININE 3.93*   < > 5.03* 5.14*   < > 5.30* 5.51* 6.33* 6.45* 6.56*  CALCIUM 7.6*   < > 7.9* 7.9*   < > 6.6* 6.4* 6.3* 5.9* 6.2*  MG 2.1  --  3.2* 2.9*  --   --   --   --  2.6*  --   PHOS 4.4  --   --   --   --   --   --   --   --   --    < > = values in this interval not displayed.   Liver Function Tests: Recent Labs  Lab 10/27/18 0614  AST 42*  ALT 23  ALKPHOS 84  BILITOT 0.4  PROT 6.1*  ALBUMIN 2.2*   No results for input(s): LIPASE, AMYLASE in the last 168 hours. No results for input(s): AMMONIA in the last 168 hours. CBC: Recent Labs  Lab 10/25/18 0429 10/27/18 0455 10/27/18 0614 10/28/18 0536 10/30/18 0500  WBC 5.4 SPECIMEN CONTAMINATED, UNABLE TO PERFORM TEST(S). 10.6* 15.4* 11.2*  HGB 11.9* SPECIMEN CONTAMINATED, UNABLE TO PERFORM TEST(S). 12.2* 11.8* 8.9*  HCT 37.5* SPECIMEN CONTAMINATED, UNABLE TO PERFORM TEST(S). 37.2* 37.2* 26.9*  MCV 97.2 SPECIMEN CONTAMINATED, UNABLE TO  PERFORM TEST(S). 96.6 100.0 94.7  PLT 206 SPECIMEN CONTAMINATED, UNABLE TO PERFORM TEST(S). 283 286 138*   Cardiac Enzymes: No results for input(s): CKTOTAL, CKMB, CKMBINDEX, TROPONINI in the last 168 hours. Sepsis Labs: Recent Labs  Lab 10/27/18 0455 10/27/18 0614 10/28/18 0536 10/30/18 0500  WBC SPECIMEN CONTAMINATED, UNABLE TO PERFORM TEST(S). 10.6* 15.4* 11.2*    Procedures/Operations  PICC 10/06/2018 Intubation 3/26  The patient was critically ill with multiple organ systems failure and required high complexity decision making for assessment and support, frequent evaluation and titration of therapies, application of advanced monitoring technologies and extensive interpretation of multiple databases. On my exam, patient was intubated and sedated with severe hypoxemia in the  70s-80s. Regular respiratory rate, vented breaths. RRR, no murmurs. Anasarca present.  Time spent discussing and coordinating care with PCCM-NP, charge nurse, RT and chaplain to arrange for safe plan for withdrawal of care.  Critical Care Time: 32 minutes   Josedejesus Marcum Mechele Collin 10/29/2018, 1:01 PM

## 2018-11-28 NOTE — Progress Notes (Signed)
30 ml of midazolam drip AND 200 ml of fentanyl drip wasted in stericycle. Witnessed by Florene Glen, RN.

## 2018-11-28 NOTE — Progress Notes (Addendum)
Time of death: 1157 Vent turned off by respiratory therapist per order. Pt became apneic then went into PEA.  This RN was on the phone with son, Coty, who was present via elink video conference. Emotional support provided to the family. Chaplain present outside the room.

## 2018-11-28 NOTE — Progress Notes (Signed)
Son Battistelli) called for update. Patient saturations dipping into the 70's despite vent adjustments with 22 PEEP / 100% FiO2.  Discussed critical nature of illness and that Mr. Breyette is dying of his illness.  Montee wants to be able to camera in to the room to be with his father when he passes.  We reviewed the "normal" process of compassionate cessation of mechanical ventilation and what that would look like under the current circumstances > ETT will not be immediately removed, mechanical ventilation will be stopped.  Sedation will continue for comfort.  Likely minutes to hours.  Family in understanding and will await camera link to be with patient.     Canary Brim, NP-C [facility] Pgr: [phone] or if no answer [phone] 11/11/2018, 10:48 AM

## 2018-11-28 NOTE — Progress Notes (Addendum)
Spiritual care providing support with family around terminal extubation.    Spoke with pt's son Jack Bowers. Via phone.  Jack Bowers is surrounded by church family who are supporting them.  They are grateful for prayers here at [place] and [place] requested prayers during extubation.  Chaplain will be present on unit during extubation and holding Jack Sr. And family in prayers.   Jack Bowers is aware of follow up resources re: grief and loss.  Has confidential voicemail number for chaplain as follow up care needs arise.     Burnis Kingfisher, MDiv, [facility]

## 2018-11-28 DEATH — deceased

## 2020-12-25 IMAGING — US RENAL/URINARY TRACT ULTRASOUND
1 series · 14 of 25 positions shown · non-contrast
Comparison: None.

CLINICAL DATA: Acute kidney injury. Chronic kidney disease. COVID
positive.

EXAM:
RENAL / URINARY TRACT ULTRASOUND COMPLETE

[Series 1: renal/urinary tract ultrasound · 14 of 36 slices shown]
[im 1/36]
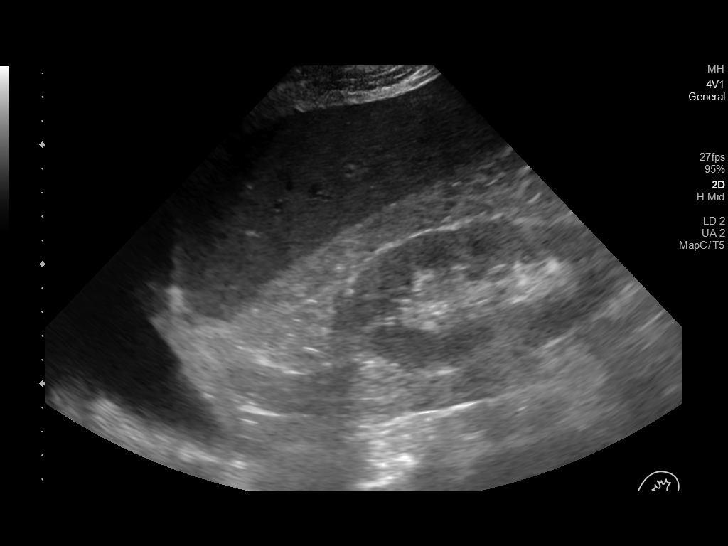
[im 3/36]
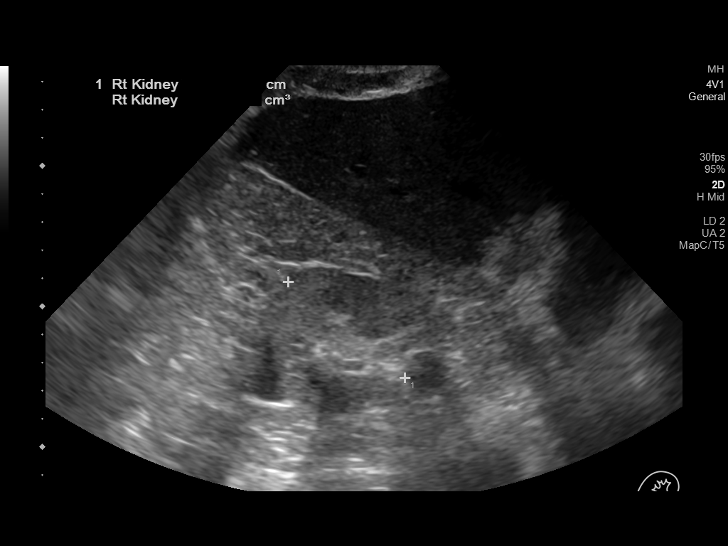
[im 6/36]
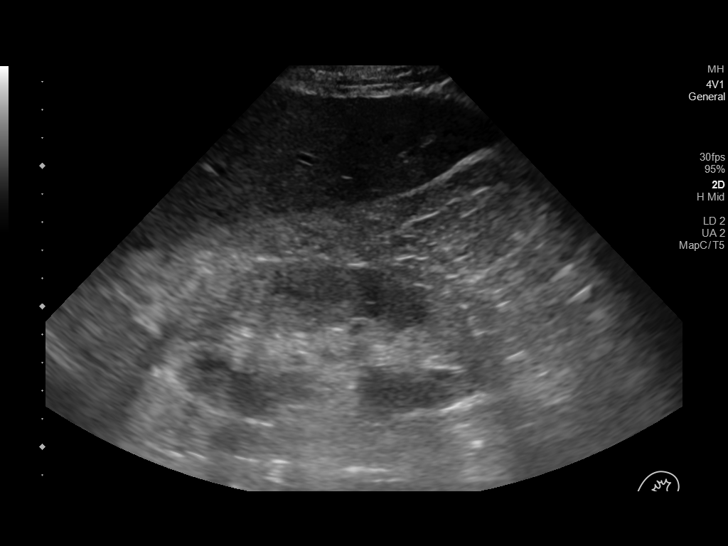
[im 9/36]
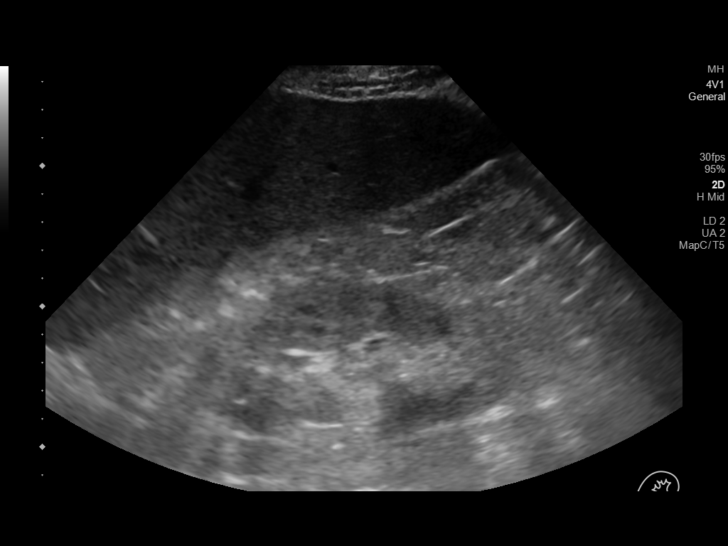
[im 12/36]
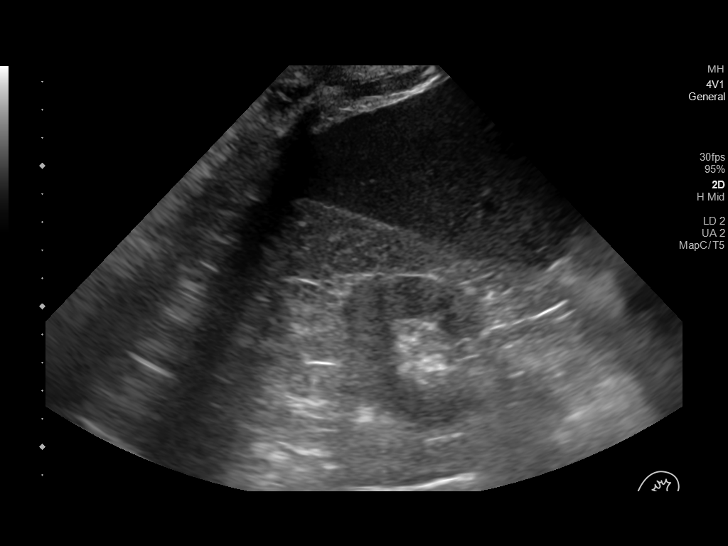
[im 14/36]
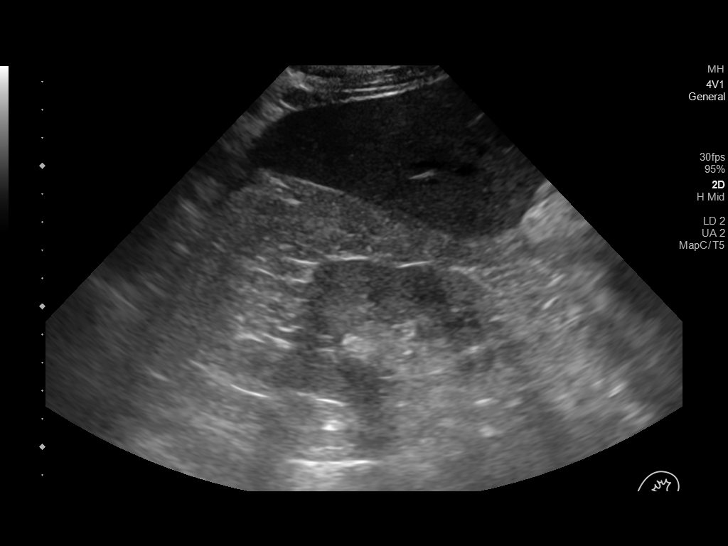
[im 17/36]
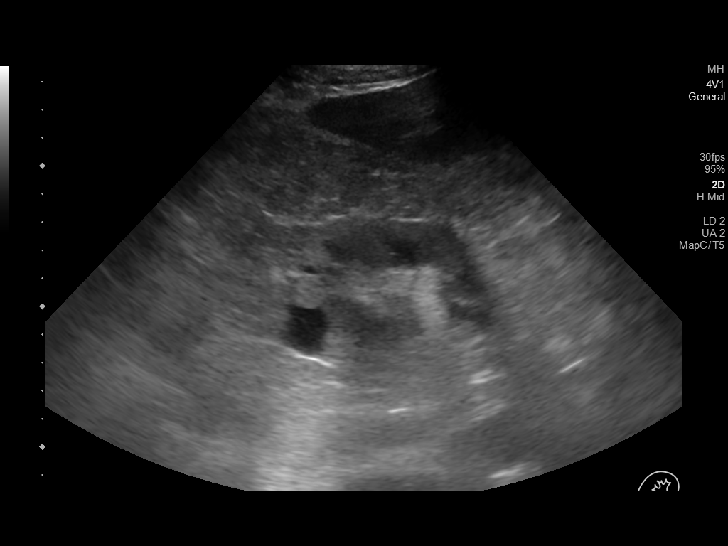
[im 19/36]
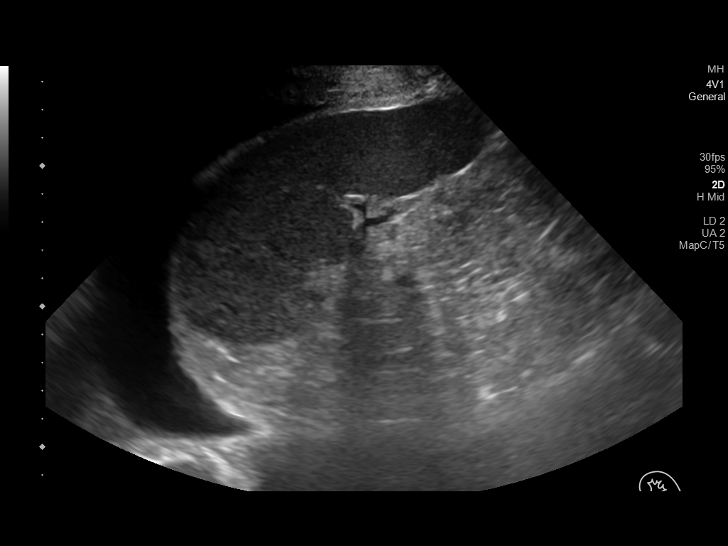
[im 22/36]
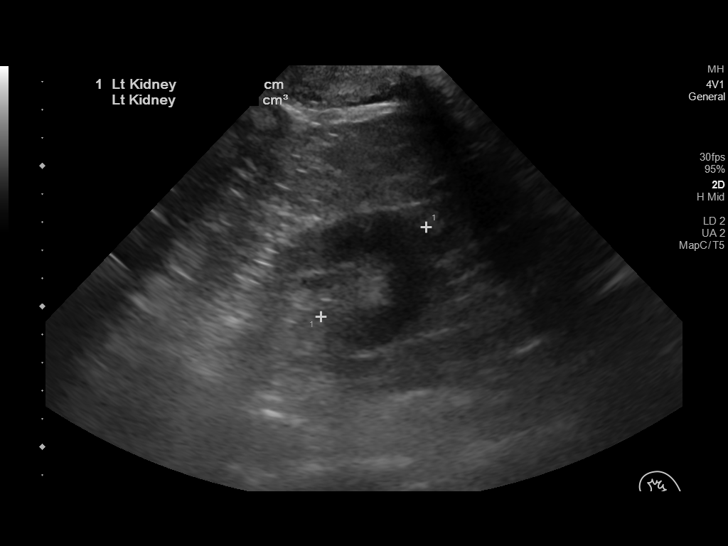
[im 24/36]
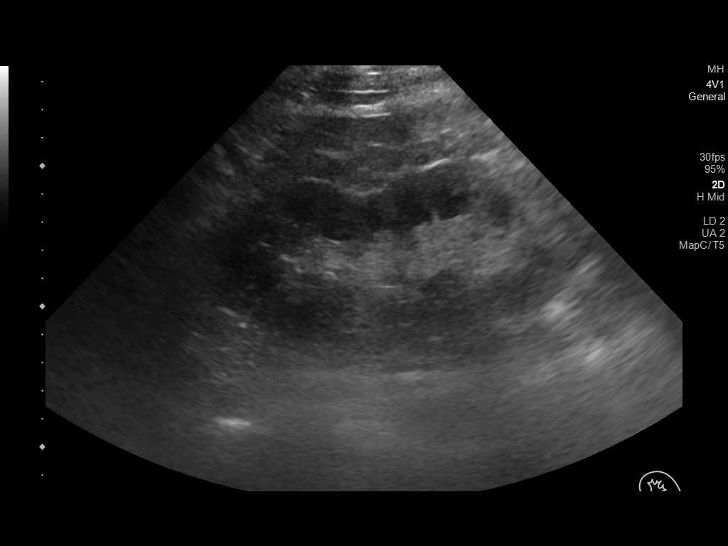
[im 27/36]
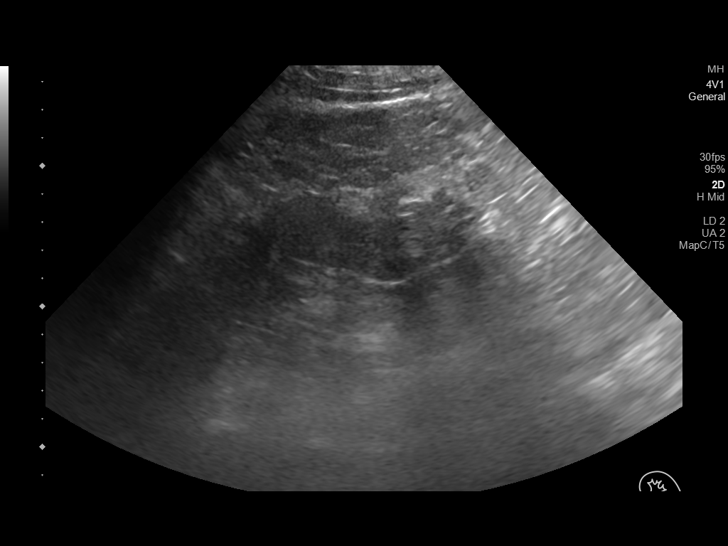
[im 30/36]
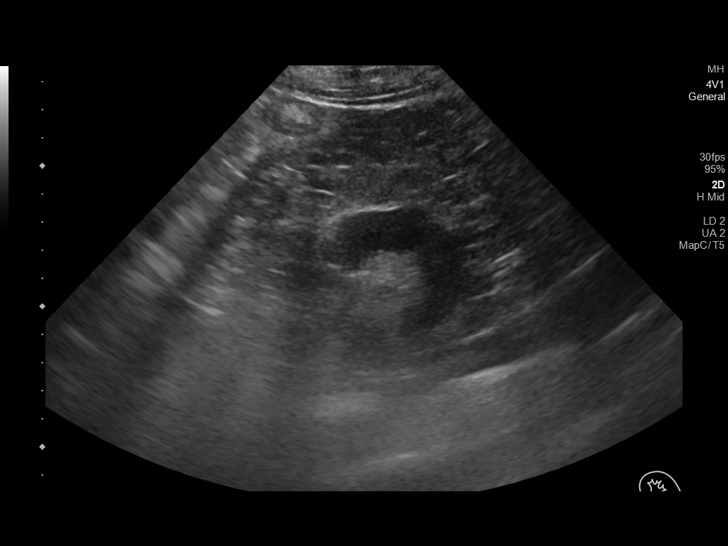
[im 33/36]
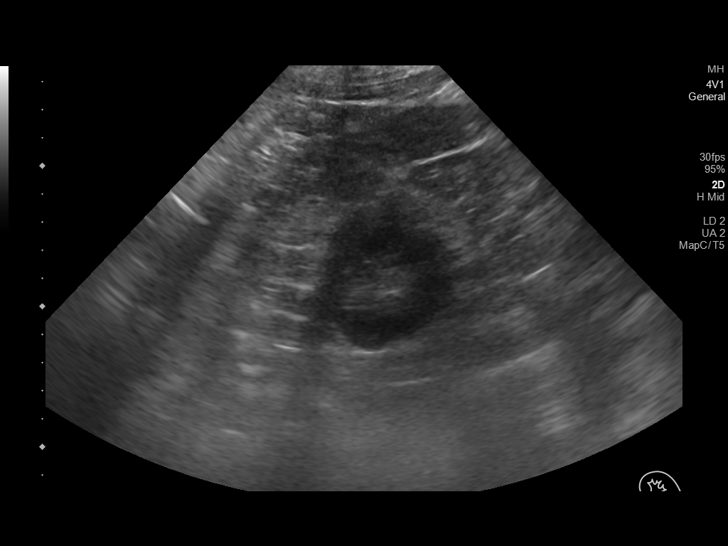
[im 36/36]
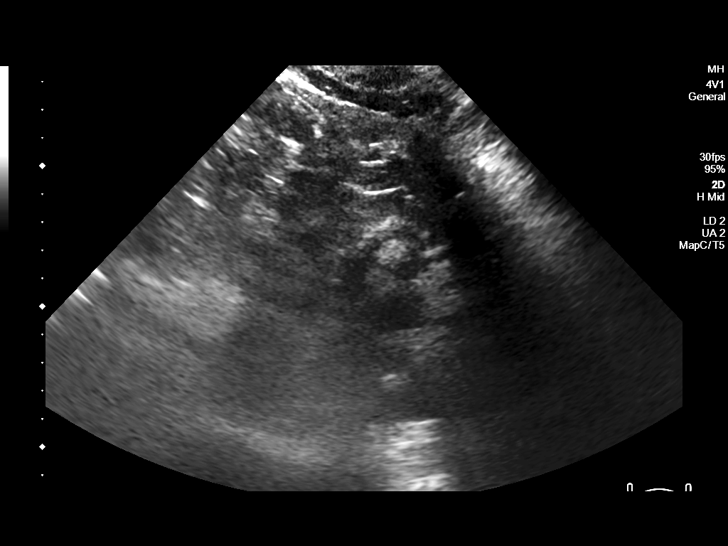

[14 of 25 positions shown; findings below may reference images not displayed]

FINDINGS: Right Kidney:

Renal measurements: 11.5 X 4.9 X 5.4 CM = volume: 160 mL. The right
kidney is diffusely echogenic. It is hyperechoic compared to the
index organ, the liver. A simple cyst at the upper pole measures
x 1.8 x 1.7 cm. No solid mass lesion or stone is present. There is
no hydronephrosis.

Left Kidney:

Renal measurements: 13.1 x 5.7 x 4.9 cm = volume: 191.7 mL. The left
kidney is diffusely echogenic. It is hyperechoic to the index organ,
the spleen. No focal lesions are present. There is no stone or
hydronephrosis.

Bladder:

A Foley catheter is in place.

Bilateral pleural effusions are noted.
IMPRESSION: 1. Kidneys are hyperechoic bilaterally. This is nonspecific, but
commonly seen in the setting of medical renal disease.
2. Simple cyst at the upper pole of the right kidney.
3. No stone, mass lesion, or hydronephrosis.
4. Bilateral pleural effusions.
